# Patient Record
Sex: Female | Born: 1955 | Race: Black or African American | Hispanic: No | Marital: Married | Smoking: Never smoker
Health system: Southern US, Community
[De-identification: ages and names within clinical notes are randomized; demographics above are authoritative.]

## PROBLEM LIST (undated history)

## (undated) DIAGNOSIS — F32A Depression, unspecified: Secondary | ICD-10-CM

## (undated) DIAGNOSIS — I10 Essential (primary) hypertension: Secondary | ICD-10-CM

## (undated) DIAGNOSIS — F329 Major depressive disorder, single episode, unspecified: Secondary | ICD-10-CM

## (undated) DIAGNOSIS — T7840XA Allergy, unspecified, initial encounter: Secondary | ICD-10-CM

## (undated) DIAGNOSIS — I639 Cerebral infarction, unspecified: Secondary | ICD-10-CM

## (undated) DIAGNOSIS — E039 Hypothyroidism, unspecified: Secondary | ICD-10-CM

## (undated) DIAGNOSIS — E669 Obesity, unspecified: Secondary | ICD-10-CM

## (undated) DIAGNOSIS — E079 Disorder of thyroid, unspecified: Secondary | ICD-10-CM

## (undated) DIAGNOSIS — R7303 Prediabetes: Secondary | ICD-10-CM

## (undated) DIAGNOSIS — I499 Cardiac arrhythmia, unspecified: Secondary | ICD-10-CM

## (undated) DIAGNOSIS — M109 Gout, unspecified: Secondary | ICD-10-CM

## (undated) DIAGNOSIS — B029 Zoster without complications: Secondary | ICD-10-CM

## (undated) DIAGNOSIS — K219 Gastro-esophageal reflux disease without esophagitis: Secondary | ICD-10-CM

## (undated) DIAGNOSIS — F419 Anxiety disorder, unspecified: Secondary | ICD-10-CM

## (undated) DIAGNOSIS — I4891 Unspecified atrial fibrillation: Secondary | ICD-10-CM

## (undated) HISTORY — DX: Gastro-esophageal reflux disease without esophagitis: K21.9

## (undated) HISTORY — DX: Gout, unspecified: M10.9

## (undated) HISTORY — DX: Disorder of thyroid, unspecified: E07.9

## (undated) HISTORY — DX: Prediabetes: R73.03

## (undated) HISTORY — DX: Anxiety disorder, unspecified: F41.9

## (undated) HISTORY — DX: Obesity, unspecified: E66.9

## (undated) HISTORY — DX: Allergy, unspecified, initial encounter: T78.40XA

## (undated) HISTORY — DX: Hypothyroidism, unspecified: E03.9

## (undated) HISTORY — DX: Zoster without complications: B02.9

---

## 1998-12-06 ENCOUNTER — Emergency Department (HOSPITAL_COMMUNITY): Admission: EM | Admit: 1998-12-06 | Discharge: 1998-12-06 | Payer: Self-pay | Admitting: Emergency Medicine

## 1999-01-03 ENCOUNTER — Ambulatory Visit (HOSPITAL_COMMUNITY): Admission: RE | Admit: 1999-01-03 | Discharge: 1999-01-03 | Payer: Self-pay | Admitting: Internal Medicine

## 1999-01-03 ENCOUNTER — Encounter: Payer: Self-pay | Admitting: Internal Medicine

## 2001-09-19 ENCOUNTER — Other Ambulatory Visit: Admission: RE | Admit: 2001-09-19 | Discharge: 2001-09-19 | Payer: Self-pay | Admitting: Obstetrics and Gynecology

## 2002-05-29 ENCOUNTER — Encounter: Payer: Self-pay | Admitting: Emergency Medicine

## 2002-05-29 ENCOUNTER — Emergency Department (HOSPITAL_COMMUNITY): Admission: EM | Admit: 2002-05-29 | Discharge: 2002-05-29 | Payer: Self-pay | Admitting: Emergency Medicine

## 2005-06-10 ENCOUNTER — Other Ambulatory Visit: Admission: RE | Admit: 2005-06-10 | Discharge: 2005-06-10 | Payer: Self-pay | Admitting: Family Medicine

## 2005-10-14 ENCOUNTER — Encounter: Admission: RE | Admit: 2005-10-14 | Discharge: 2005-10-14 | Payer: Self-pay | Admitting: Family Medicine

## 2006-09-16 ENCOUNTER — Other Ambulatory Visit: Admission: RE | Admit: 2006-09-16 | Discharge: 2006-09-16 | Payer: Self-pay | Admitting: Obstetrics and Gynecology

## 2007-08-22 ENCOUNTER — Emergency Department (HOSPITAL_COMMUNITY): Admission: EM | Admit: 2007-08-22 | Discharge: 2007-08-22 | Payer: Self-pay | Admitting: Emergency Medicine

## 2011-10-18 ENCOUNTER — Ambulatory Visit (INDEPENDENT_AMBULATORY_CARE_PROVIDER_SITE_OTHER): Payer: 59

## 2011-10-18 DIAGNOSIS — S8010XA Contusion of unspecified lower leg, initial encounter: Secondary | ICD-10-CM

## 2011-10-18 DIAGNOSIS — S335XXA Sprain of ligaments of lumbar spine, initial encounter: Secondary | ICD-10-CM

## 2011-10-18 DIAGNOSIS — M62838 Other muscle spasm: Secondary | ICD-10-CM

## 2012-01-05 ENCOUNTER — Ambulatory Visit (INDEPENDENT_AMBULATORY_CARE_PROVIDER_SITE_OTHER): Payer: 59 | Admitting: Obstetrics and Gynecology

## 2012-01-05 DIAGNOSIS — Z01419 Encounter for gynecological examination (general) (routine) without abnormal findings: Secondary | ICD-10-CM

## 2014-05-04 ENCOUNTER — Emergency Department (HOSPITAL_COMMUNITY): Payer: 59

## 2014-05-04 ENCOUNTER — Encounter (HOSPITAL_COMMUNITY): Payer: Self-pay | Admitting: Emergency Medicine

## 2014-05-04 ENCOUNTER — Emergency Department (HOSPITAL_COMMUNITY)
Admission: EM | Admit: 2014-05-04 | Discharge: 2014-05-04 | Disposition: A | Discharge status: Home / Self Care | Payer: 59 | Attending: Emergency Medicine | Admitting: Emergency Medicine

## 2014-05-04 ENCOUNTER — Other Ambulatory Visit: Payer: Self-pay | Admitting: Physician Assistant

## 2014-05-04 DIAGNOSIS — I10 Essential (primary) hypertension: Secondary | ICD-10-CM | POA: Insufficient documentation

## 2014-05-04 DIAGNOSIS — R112 Nausea with vomiting, unspecified: Secondary | ICD-10-CM | POA: Insufficient documentation

## 2014-05-04 DIAGNOSIS — Z8659 Personal history of other mental and behavioral disorders: Secondary | ICD-10-CM | POA: Insufficient documentation

## 2014-05-04 DIAGNOSIS — R0789 Other chest pain: Secondary | ICD-10-CM

## 2014-05-04 DIAGNOSIS — Z7982 Long term (current) use of aspirin: Secondary | ICD-10-CM | POA: Insufficient documentation

## 2014-05-04 DIAGNOSIS — Z8673 Personal history of transient ischemic attack (TIA), and cerebral infarction without residual deficits: Secondary | ICD-10-CM | POA: Insufficient documentation

## 2014-05-04 DIAGNOSIS — Z79899 Other long term (current) drug therapy: Secondary | ICD-10-CM | POA: Insufficient documentation

## 2014-05-04 DIAGNOSIS — R079 Chest pain, unspecified: Secondary | ICD-10-CM

## 2014-05-04 HISTORY — DX: Cardiac arrhythmia, unspecified: I49.9

## 2014-05-04 HISTORY — DX: Essential (primary) hypertension: I10

## 2014-05-04 HISTORY — DX: Major depressive disorder, single episode, unspecified: F32.9

## 2014-05-04 HISTORY — DX: Cerebral infarction, unspecified: I63.9

## 2014-05-04 HISTORY — DX: Depression, unspecified: F32.A

## 2014-05-04 LAB — CBC WITH DIFFERENTIAL/PLATELET
BASOS ABS: 0 10*3/uL (ref 0.0–0.1)
Basophils Relative: 1 % (ref 0–1)
EOS PCT: 1 % (ref 0–5)
Eosinophils Absolute: 0.1 10*3/uL (ref 0.0–0.7)
HCT: 36.1 % (ref 36.0–46.0)
HEMOGLOBIN: 11.7 g/dL — AB (ref 12.0–15.0)
LYMPHS ABS: 1.4 10*3/uL (ref 0.7–4.0)
LYMPHS PCT: 25 % (ref 12–46)
MCH: 28.6 pg (ref 26.0–34.0)
MCHC: 32.4 g/dL (ref 30.0–36.0)
MCV: 88.3 fL (ref 78.0–100.0)
MONO ABS: 0.5 10*3/uL (ref 0.1–1.0)
MONOS PCT: 9 % (ref 3–12)
NEUTROS ABS: 3.7 10*3/uL (ref 1.7–7.7)
Neutrophils Relative %: 64 % (ref 43–77)
Platelets: 175 10*3/uL (ref 150–400)
RBC: 4.09 MIL/uL (ref 3.87–5.11)
RDW: 14.2 % (ref 11.5–15.5)
WBC: 5.7 10*3/uL (ref 4.0–10.5)

## 2014-05-04 LAB — COMPREHENSIVE METABOLIC PANEL
ALBUMIN: 3.6 g/dL (ref 3.5–5.2)
ALK PHOS: 74 U/L (ref 39–117)
ALT: 23 U/L (ref 0–35)
AST: 27 U/L (ref 0–37)
BUN: 16 mg/dL (ref 6–23)
CO2: 26 mEq/L (ref 19–32)
CREATININE: 0.72 mg/dL (ref 0.50–1.10)
Calcium: 9.7 mg/dL (ref 8.4–10.5)
Chloride: 99 mEq/L (ref 96–112)
GFR calc Af Amer: >90 mL/min (ref >90)
GFR calc non Af Amer: >90 mL/min (ref >90)
Glucose, Bld: 150 mg/dL — ABNORMAL HIGH (ref 70–99)
POTASSIUM: 3.5 meq/L — AB (ref 3.7–5.3)
Sodium: 140 mEq/L (ref 137–147)
Total Bilirubin: 0.6 mg/dL (ref 0.3–1.2)
Total Protein: 7.6 g/dL (ref 6.0–8.3)

## 2014-05-04 LAB — URINALYSIS, ROUTINE W REFLEX MICROSCOPIC
Bilirubin Urine: NEGATIVE
GLUCOSE, UA: NEGATIVE mg/dL
Hgb urine dipstick: NEGATIVE
Ketones, ur: NEGATIVE mg/dL
LEUKOCYTES UA: NEGATIVE
Nitrite: NEGATIVE
PH: 7.5 (ref 5.0–8.0)
Protein, ur: NEGATIVE mg/dL
Specific Gravity, Urine: 1.015 (ref 1.005–1.030)
Urobilinogen, UA: 1 mg/dL (ref 0.0–1.0)

## 2014-05-04 LAB — TROPONIN I
Troponin I: <0.3 ng/mL (ref <0.30)
Troponin I: <0.3 ng/mL (ref <0.30)

## 2014-05-04 IMAGING — CR DG CHEST 2V
2 series · 2 of 2 positions shown · non-contrast
Comparison: [DATE]

CLINICAL DATA: Chest pain, 2 days duration. History of
hypertension.

EXAM:
CHEST  2 VIEW

[w chest pa]
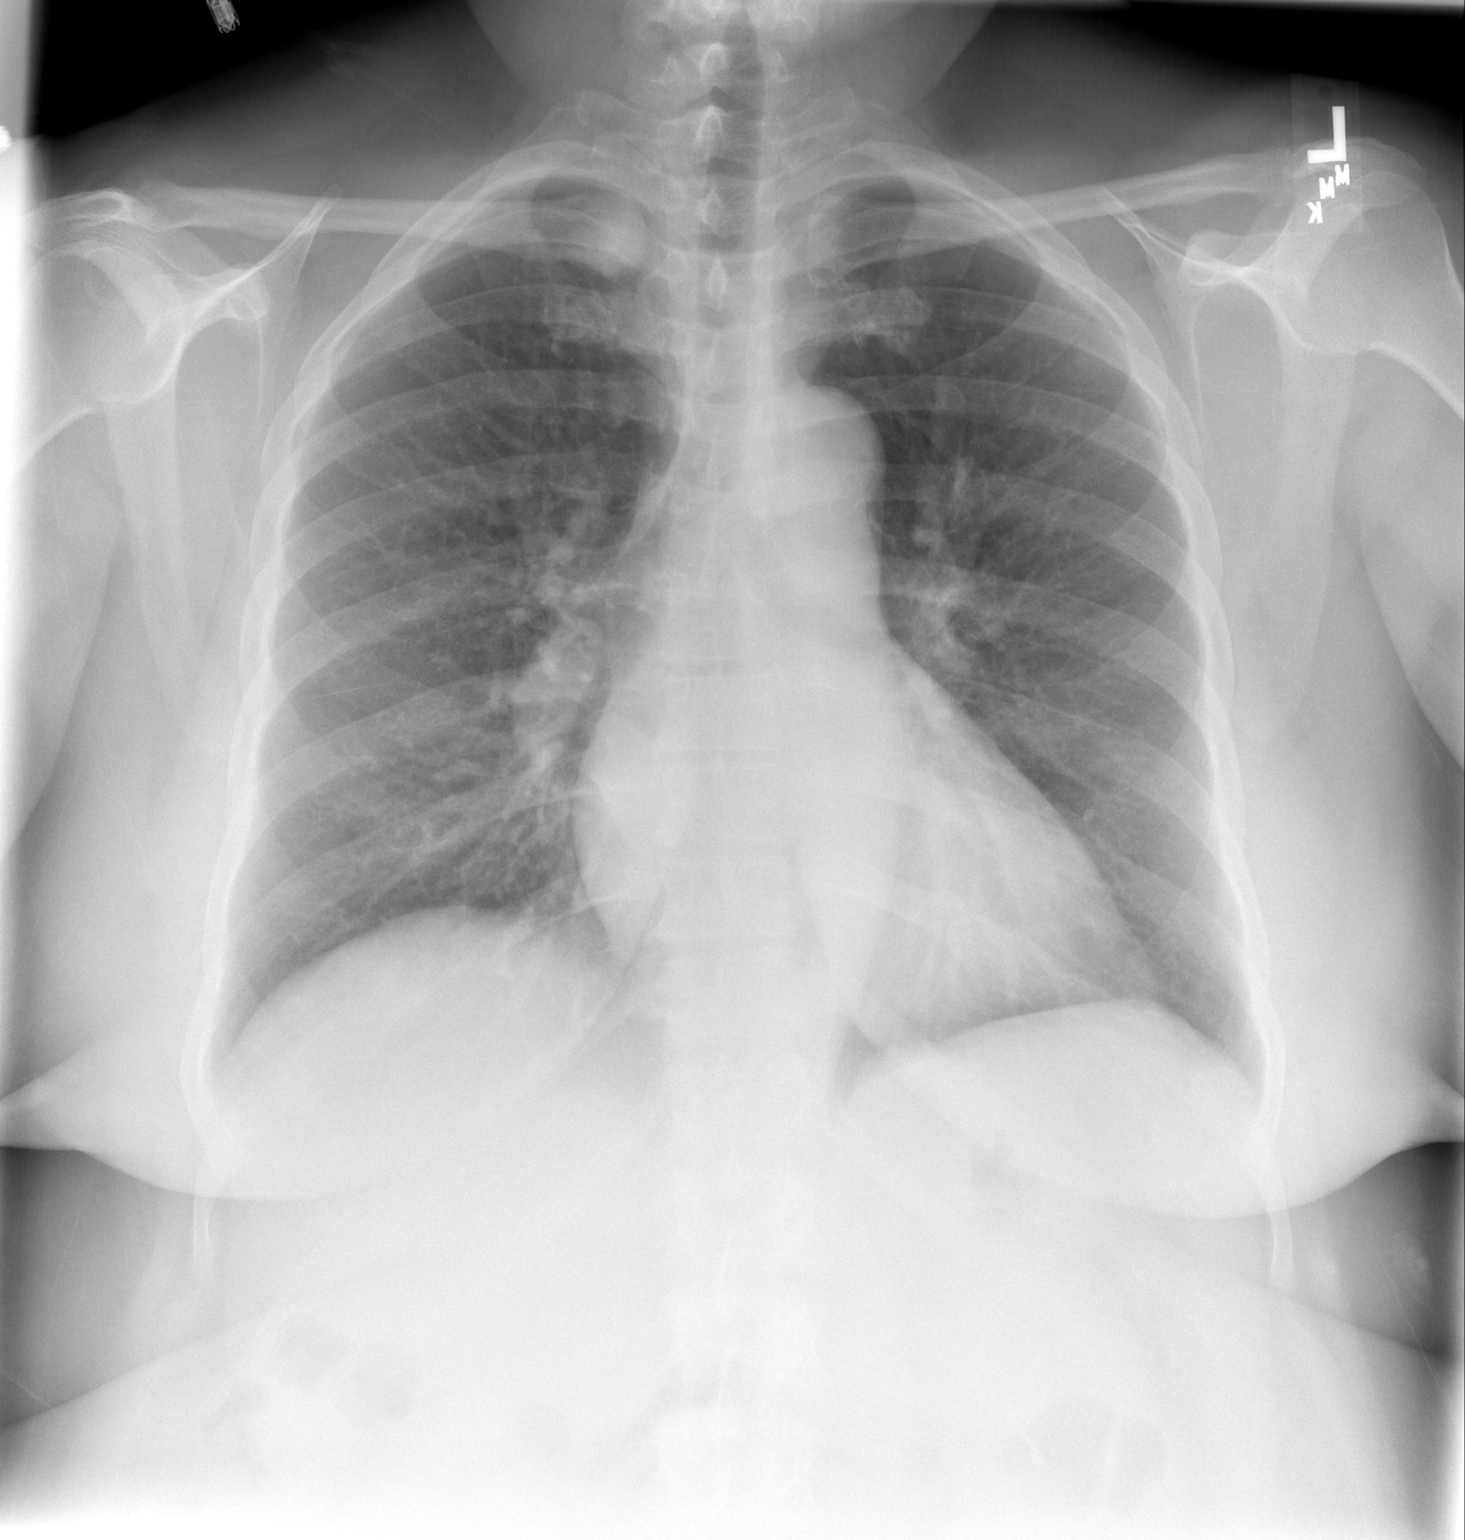

[w chest lat]
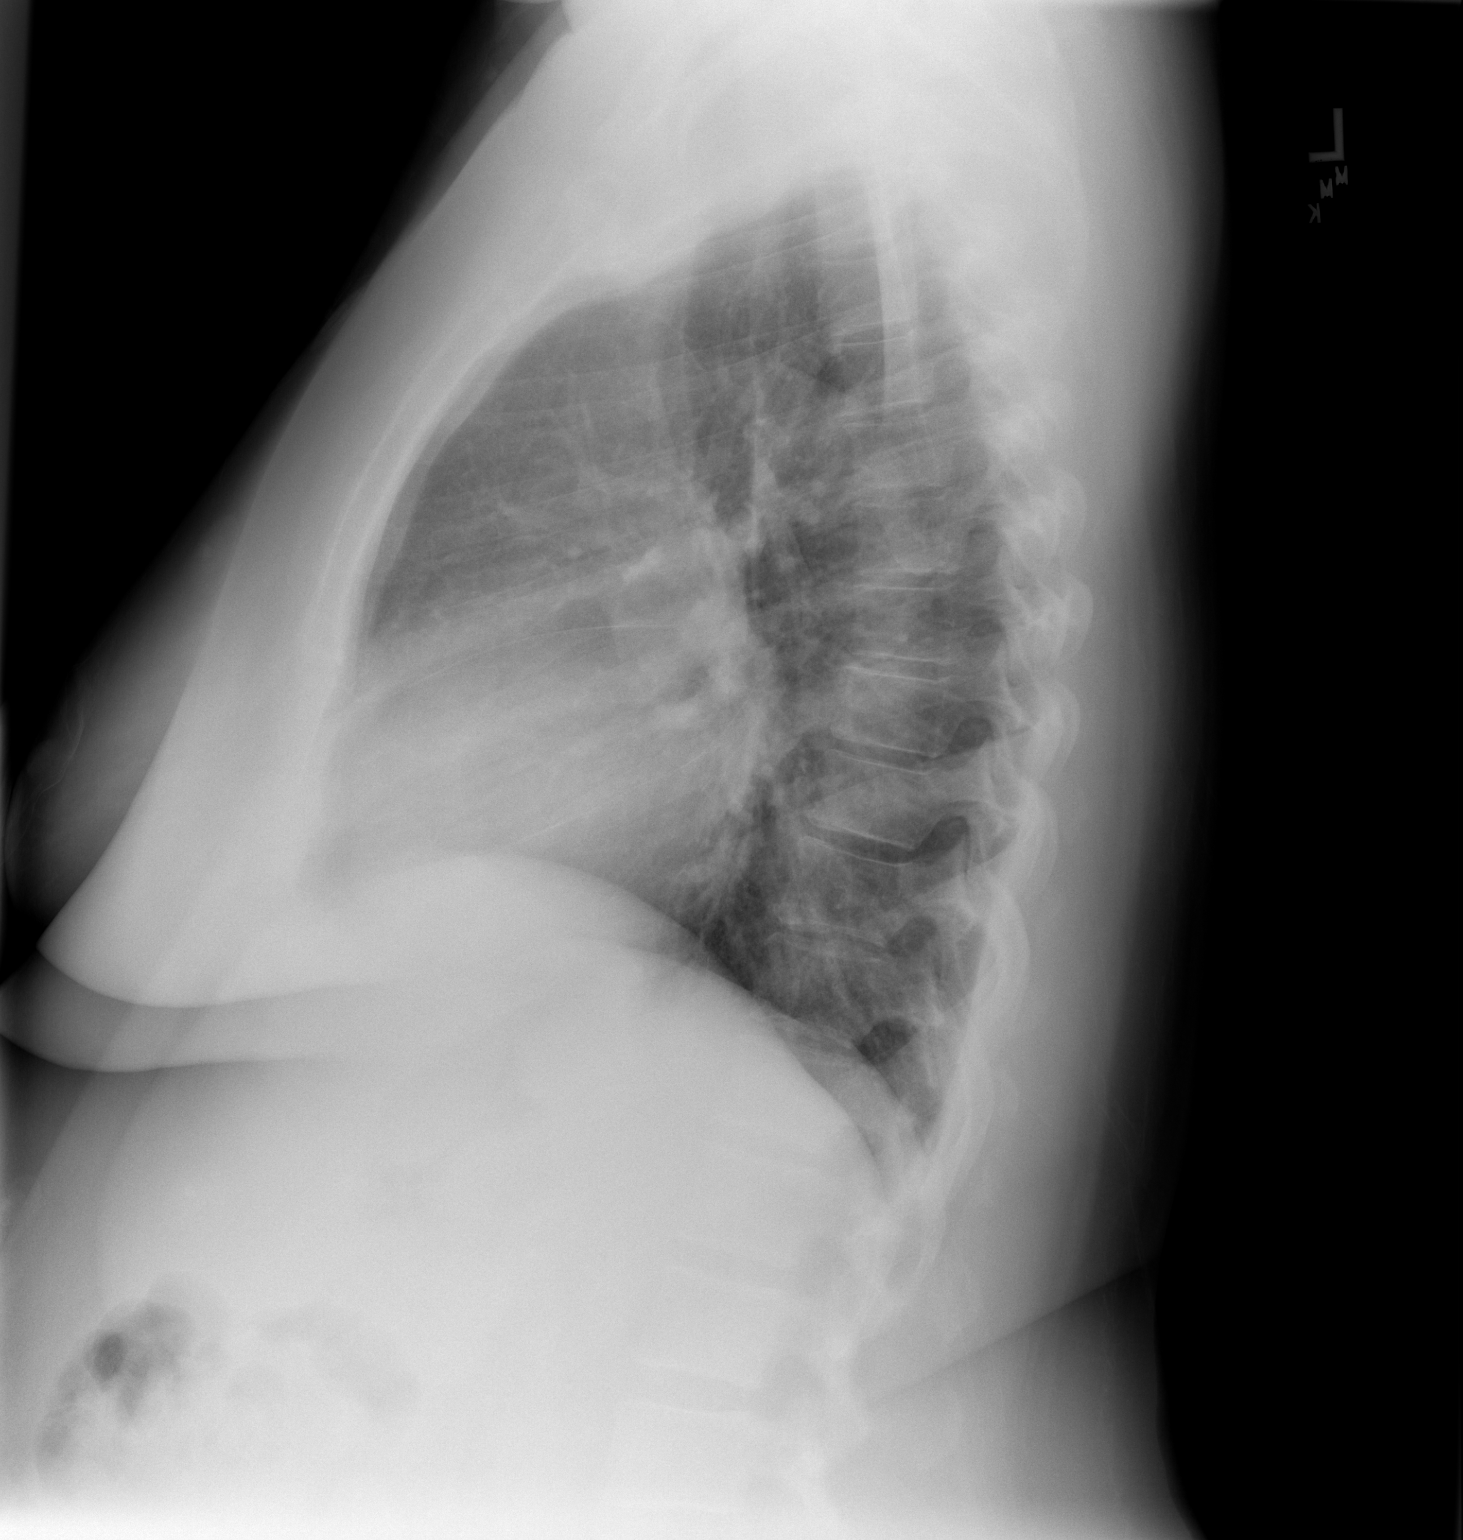

[2 of 2 positions shown; findings below may reference images not displayed]

FINDINGS: Heart size is normal. Mediastinal shadows are normal. The lungs are
clear. No effusions. There is sclerosis of the medial clavicle on
the right, increased since [DZ]. This is likely secondary to
degenerative arthritis. This does not represent a lung mass. No
spinal abnormality.
IMPRESSION: No active cardiopulmonary disease.

## 2014-05-04 MED ORDER — ASPIRIN 81 MG PO CHEW
324.0000 mg | CHEWABLE_TABLET | Freq: Once | ORAL | Status: AC
  Administered 2014-05-04: 324 mg via ORAL
  Filled 2014-05-04: qty 4

## 2014-05-04 MED ORDER — ASPIRIN 81 MG PO TBEC: 81.0000 mg | DELAYED_RELEASE_TABLET | Freq: Every day | ORAL | Status: DC

## 2014-05-04 NOTE — ED Notes (Signed)
Was at work and had cp  Got  nervous at work from talking to co workers  Had vomited this am  Pain mid sternal tightness

## 2014-05-04 NOTE — ED Provider Notes (Signed)
Medical screening examination/treatment/procedure(s) were conducted as a shared visit with non-physician practitioner(s) and myself.  I personally evaluated the patient during the encounter.  Episodes of chest pressure in setting of emotional stress. Associated with nausea.  No cardiac history. EKG with new T wave inversions inferior and laterally.   EKG Interpretation   Date/Time:  Friday May 04 2014 10:43:09 EDT Ventricular Rate:  86 PR Interval:  154 QRS Duration: 82 QT Interval:  368 QTC Calculation: 440 R Axis:   57 Text Interpretation:  Normal sinus rhythm Nonspecific T wave abnormality  Abnormal ECG T wave inversions lateral and inferior  Confirmed by Wyvonnia Dusky   MD, STEPHEN ([id number]) on 05/04/2014 12:39:47 PM       Ezequiel Essex, MD 05/04/14 1031

## 2014-05-04 NOTE — ED Notes (Signed)
Pt c/o 3-4 min episode of central CP, no radiation, that started after she got very upset, then had her husband drive her to the ED. Reports she also had an episode last night after having a stressful day at work, went home and got a HA and took ibuprofen. Pt sts she is feeling very anxious. Reports now the cp is pretty much gone but she is having some SOB. sts earlier she had nausea and vomited X 1. Denies abd pain. Nad, skin warm and dry, resp e/u.

## 2014-05-04 NOTE — ED Notes (Signed)
Pt returned from radiology. Ambulated to restroom with no issues.

## 2014-05-04 NOTE — Discharge Instructions (Signed)
Please follow up with your primary care physician in 1-2 days. If you do not have one please call the [facility] number listed above. Please follow up at your scheduled appointment with the cardiology office. Please take aspirin daily as prescribed. Please do not work until after your stress test as advised by the cardiologist. Please read all discharge instructions and return precautions.    Chest Pain (Nonspecific) It is often hard to give a specific diagnosis for the cause of chest pain. There is always a chance that your pain could be related to something serious, such as a heart attack or a blood clot in the lungs. You need to follow up with your health care provider for further evaluation. CAUSES   Heartburn.  Pneumonia or bronchitis.  Anxiety or stress.  Inflammation around your heart (pericarditis) or lung (pleuritis or pleurisy).  A blood clot in the lung.  A collapsed lung (pneumothorax). It can develop suddenly on its own (spontaneous pneumothorax) or from trauma to the chest.  Shingles infection (herpes zoster virus). The chest wall is composed of bones, muscles, and cartilage. Any of these can be the source of the pain.  The bones can be bruised by injury.  The muscles or cartilage can be strained by coughing or overwork.  The cartilage can be affected by inflammation and become sore (costochondritis). DIAGNOSIS  Lab tests or other studies may be needed to find the cause of your pain. Your health care provider may have you take a test called an ambulatory electrocardiogram (ECG). An ECG records your heartbeat patterns over a 24-hour period. You may also have other tests, such as:  Transthoracic echocardiogram (TTE). During echocardiography, sound waves are used to evaluate how blood flows through your heart.  Transesophageal echocardiogram (TEE).  Cardiac monitoring. This allows your health care provider to monitor your heart rate and rhythm in real  time.  Holter monitor. This is a portable device that records your heartbeat and can help diagnose heart arrhythmias. It allows your health care provider to track your heart activity for several days, if needed.  Stress tests by exercise or by giving medicine that makes the heart beat faster. TREATMENT   Treatment depends on what may be causing your chest pain. Treatment may include:  Acid blockers for heartburn.  Anti-inflammatory medicine.  Pain medicine for inflammatory conditions.  Antibiotics if an infection is present.  You may be advised to change lifestyle habits. This includes stopping smoking and avoiding alcohol, caffeine, and chocolate.  You may be advised to keep your head raised (elevated) when sleeping. This reduces the chance of acid going backward from your stomach into your esophagus. Most of the time, nonspecific chest pain will improve within 2-3 days with rest and mild pain medicine.  HOME CARE INSTRUCTIONS   If antibiotics were prescribed, take them as directed. Finish them even if you start to feel better.  For the next few days, avoid physical activities that bring on chest pain. Continue physical activities as directed.  Do not use any tobacco products, including cigarettes, chewing tobacco, or electronic cigarettes.  Avoid drinking alcohol.  Only take medicine as directed by your health care provider.  Follow your health care provider's suggestions for further testing if your chest pain does not go away.  Keep any follow-up appointments you made. If you do not go to an appointment, you could develop lasting (chronic) problems with pain. If there is any problem keeping an appointment, call to reschedule.  SEEK MEDICAL CARE IF:   Your chest pain does not go away, even after treatment.  You have a rash with blisters on your chest.  You have a fever. SEEK IMMEDIATE MEDICAL CARE IF:   You have increased chest pain or pain that spreads to your arm,  neck, jaw, back, or abdomen.  You have shortness of breath.  You have an increasing cough, or you cough up blood.  You have severe back or abdominal pain.  You feel nauseous or vomit.  You have severe weakness.  You faint.  You have chills. This is an emergency. Do not wait to see if the pain will go away. Get medical help at once. Call your local emergency services (911 in [place] Do not drive yourself to the hospital. MAKE SURE YOU:   Understand these instructions.  Will watch your condition.  Will get help right away if you are not doing well or get worse. Document Released: 08/05/2005 Document Revised: 10/31/2013 Document Reviewed: 05/31/2008 [facility] Patient Information 2015 [place], [place]. This information is not intended to replace advice given to you by your health care provider. Make sure you discuss any questions you have with your health care provider.

## 2014-05-04 NOTE — Consult Note (Signed)
CARDIOLOGY CONSULT NOTE   Patient ID: FRANSHESCA CHIPMAN MRN: [id number], DOB/AGE: 1956/05/30   Admit date: 05/04/2014 Date of Consult: 05/04/2014   Primary Physician: Osborne Casco, MD Primary Cardiologist: Dr. Candee Furbish, last seen in 2013  Pt. Profile  An obese 58 year old African American female with history of hypertension, stroke, depression, hypothyroidism, and history of irregular heartbeat presented with several episodes of chest pain after stressful encounter at work with her supervisor.  Problem List  Past Medical History  Diagnosis Date  . Depression   . Stroke   . Hypertension   . Irregular heart beat     Past Surgical History  Procedure Laterality Date  . Cesarean section       Allergies  No Known Allergies  HPI   The patient is obese 58 year old [place] American female with history of hypertension, stroke, depression, hypothyroidism, and history of irregular heartbeat. According to the patient, she never had any history of coronary disease nor did she ever had any stress test or echocardiogram. She was told her cholesterol was high however she should try to control it with diet. She was referred to an cardiologist in 2012 for your regular heartbeat. An EKG was obtained at that time and she was sent home with a monitor. She was subsequently placed on metoprolol since that time and has not had any problem since. She also has chronic lower extremity swelling for which she take triamterene/hydrochlorothiazide combination. She states she has been undergoing a lot of stress at work lately as she had a lot of problem with one of her supervisor who has been very demanding of her. She had one episode of chest pain with headache on Wednesday 05/02/2014 after a stressful encounter with her supervisor. The episode only lasted about 4 to 5 minutes and are relieved with resting. She described the symptom as substernal dull pressure that wraps around her neck. She states she  had similar episodes before Wednesday, she does not recall any starting point, however states her chest discomfort usually occur after a stressful situation. Yesterday, patient spent an hour cleaning her garage and move light boxes without significant chest discomfort. She states she has not been active at home. This morning around 10:00am, patient was put into a stressful situation again with the same supervisor. Shortly afterward, she had  pressure-like chest discomfort radiating to her neck, she also endorsed some dizziness, nausea, vomiting and mild SOB. The episode lasted about 5 minutes and quickly resolved with resting. Patient does not believe she was having a heart attack, however decided to seek medical attention at [facility].  On arrival, her vitals were normal. Significant laboratory finding include potassium of 3.5, creatinine 0.72, hemoglobin 11.7 and negative troponin. CXR is negative for acute process. EKG shows NSR with T wave inversion in anterior leads. The chest discomfort recurred again around 11 and again quickly resolved. Patient is currently asymptomatic. Cardiology was consulted for chest pain.  Inpatient Medications    Family History Family History  Problem Relation Age of Onset  . CAD Mother     started in her 44s  . CAD Father     started in his late 49s  . Diabetes Mellitus II Mother   . Hyperlipidemia Mother      Social History History   Social History  . Marital Status: Married    Spouse Name: N/A    Number of Children: N/A  . Years of Education: N/A   Occupational History  . Not  on file.   Social History Main Topics  . Smoking status: Never Smoker   . Smokeless tobacco: Not on file  . Alcohol Use: Yes     Comment: rarely drink once a month  . Drug Use: No     Comment: denies any illicit drug use  . Sexual Activity: Not on file   Other Topics Concern  . Not on file   Social History Narrative  . No narrative on file     Review of  Systems  General:  No chills, fever, night sweats or weight changes.  Cardiovascular:  No dyspnea on exertion, edema, orthopnea, palpitations, paroxysmal nocturnal dyspnea. + chest pain Dermatological: No rash, lesions/masses Respiratory: No cough. + SOB associated with chest pain, quickly resolved Urologic: No hematuria, dysuria Abdominal:   No diarrhea, bright red blood per rectum, melena, or hematemesis. +N/V Neurologic:  No visual changes, wkns, changes in mental status. All other systems reviewed and are otherwise negative except as noted above.  Physical Exam  Blood pressure 134/60, pulse 71, temperature 97.8 F (36.6 C), temperature source Oral, resp. rate 20, SpO2 100.00%.  General: Pleasant, NAD Psych: Normal affect. Neuro: Alert and oriented X 3. Moves all extremities spontaneously. HEENT: Normal  Neck: Supple without bruits or JVD. Lungs:  Resp regular and unlabored, CTA. Heart: RRR no s3, s4, or murmurs. Abdomen: Soft, non-tender, non-distended, BS + x 4.  Extremities: No clubbing, cyanosis or edema. DP/PT/Radials 2+ and equal bilaterally.  Labs   Recent Labs  05/04/14 1055  TROPONINI <0.30   Lab Results  Component Value Date   WBC 5.7 05/04/2014   HGB 11.7* 05/04/2014   HCT 36.1 05/04/2014   MCV 88.3 05/04/2014   PLT 175 05/04/2014    Recent Labs Lab 05/04/14 1055  NA 140  K 3.5*  CL 99  CO2 26  BUN 16  CREATININE 0.72  CALCIUM 9.7  PROT 7.6  BILITOT 0.6  ALKPHOS 74  ALT 23  AST 27  GLUCOSE 150*    Radiology/Studies  Dg Chest 2 View  05/04/2014   CLINICAL DATA:  Chest pain, 2 days duration. History of hypertension.  EXAM: CHEST  2 VIEW  COMPARISON:  10/14/2005  FINDINGS: Heart size is normal. Mediastinal shadows are normal. The lungs are clear. No effusions. There is sclerosis of the medial clavicle on the right, increased since 2006. 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 This does not represent a lung mass. No spinal abnormality.   IMPRESSION: No active cardiopulmonary disease.   Electronically Signed   By: Nelson Chimes M.D.   On: 05/04/2014 11:48    ECG  NSR with HR 80s, t wave inversion in anterior leads   ASSESSMENT AND PLAN  1. Atypical chest pain in the setting of emotional stress  - discussed workup including obs overnight with stress test in the morning vs outpatient stress test, patient want outpatient stress test  - add aspirin 81mg  daily to her regimen, continue metoprolol  - plan for outpatient stress test in [facility] early next week  - office will call patient to schedule  - no work prior to stress test result  - patient advised to seek medical attention if having unremitting CP for >6minutes or severe SOB or dizziness, at which time, she is advised to seek medical attention immediately  - ok to discharge from ED   2. Hypertension: well controlled   Hilbert Corrigan, PA-C 05/04/2014, 1:53 PM  Patient seen and examined with Isaac Laud  Meng, PA-C. We discussed all aspects of the encounter. I agree with the assessment and plan as stated above.   CP with mostly atypical features. However she has several CRFs. CE normal. ECG non-specific abnormalities but no old to compare. No exertional symptoms. We discussed options of overnight observation with stress test in am or outpatient stress test. She prefers the latter. She knows to return immediately if CP returns.  Daniel Bensimhon,MD 4:28 PM

## 2014-05-04 NOTE — ED Provider Notes (Signed)
CSN: [id number]     Arrival date & time 05/04/14  1038 History   First MD Initiated Contact with Patient 05/04/14 1056     Chief Complaint  Patient presents with  . Chest Pain     (Consider location/radiation/quality/duration/timing/severity/associated sxs/prior Treatment) HPI Comments: Patient is a 58 year old female past medical history significant for depression, stroke and hypertension presented to the emergency department for 2 episodes of central chest pressure w/o radiation. First episode was last night right before bed, resolved with ibuprofen. Patient states she was at work this morning is having a disagreement with his coworkers, felt very nervous and developed central chest pressure with nausea and one episode of nonbloody nonbilious vomiting around 9:45 AM. Patient states the chest pressure has improved greatly he states that, "it is still present." Patient has not been seen by a cardiologist in several years. No history of stress test, echocardiogram, cardiac catheterization. Patient has worn a Holter monitor in the past.   Past Medical History  Diagnosis Date  . Depression   . Stroke   . Hypertension   . Irregular heart beat    Past Surgical History  Procedure Laterality Date  . Cesarean section     Family History  Problem Relation Age of Onset  . CAD Mother     started in her 21s  . CAD Father     started in his late 81s  . Diabetes Mellitus II Mother   . Hyperlipidemia Mother    History  Substance Use Topics  . Smoking status: Never Smoker   . Smokeless tobacco: Not on file  . Alcohol Use: Yes     Comment: rarely drink once a month   OB History   Grav Para Term Preterm Abortions TAB SAB Ect Mult Living                 Review of Systems  Constitutional: Negative for fever and chills.  Respiratory: Negative for shortness of breath.   Cardiovascular: Positive for chest pain.  Gastrointestinal: Positive for nausea and vomiting. Negative for abdominal pain.   All other systems reviewed and are negative.     Allergies  Review of patient's allergies indicates no known allergies.  Home Medications   Prior to Admission medications   Medication Sig Start Date End Date Taking? Authorizing Provider  levothyroxine (SYNTHROID, LEVOTHROID) 75 MCG tablet Take 75 mcg by mouth daily before breakfast.   Yes Historical Provider, MD  metoprolol succinate (TOPROL-XL) 25 MG 24 hr tablet Take 25 mg by mouth daily.   Yes Historical Provider, MD  Multiple Vitamin (MULTIVITAMIN WITH MINERALS) TABS tablet Take 1 tablet by mouth daily.   Yes Historical Provider, MD  potassium chloride (K-DUR) 10 MEQ tablet Take 10 mEq by mouth every other day.    Yes Historical Provider, MD  PRESCRIPTION MEDICATION Take 1 tablet by mouth daily. Patient states she takes a medication for depression.  I called her pharmacy and they have not filled anything since 2013 for depression.  Husband will get bottles and bring in if patient stays.   Yes Historical Provider, MD  triamterene-hydrochlorothiazide (MAXZIDE) 75-50 MG per tablet Take 1 tablet by mouth daily.   Yes Historical Provider, MD  aspirin 81 MG EC tablet Take 1 tablet (81 mg total) by mouth daily. Swallow whole. 05/04/14   Saed Hudlow L Hasnain Manheim, PA-C   BP 133/65  Pulse 69  Temp(Src) 97.8 F (36.6 C) (Oral)  Resp 20  SpO2 99% Physical Exam  Nursing note  and vitals reviewed. Constitutional: She is oriented to person, place, and time. She appears well-developed and well-nourished. No distress.  HENT:  Head: Normocephalic and atraumatic.  Right Ear: External ear normal.  Left Ear: External ear normal.  Nose: Nose normal.  Mouth/Throat: Oropharynx is clear and moist. No oropharyngeal exudate.  Eyes: Conjunctivae are normal.  Neck: Neck supple.  Cardiovascular: Normal rate, regular rhythm, normal heart sounds and intact distal pulses.   Pulmonary/Chest: Effort normal and breath sounds normal. No accessory muscle usage. No  respiratory distress. She exhibits no tenderness.  Abdominal: Soft. Bowel sounds are normal. There is no tenderness.  Musculoskeletal: Normal range of motion. She exhibits no edema.  Neurological: She is alert and oriented to person, place, and time.  Skin: Skin is warm and dry. She is not diaphoretic.    ED Course  Procedures (including critical care time) Medications  aspirin chewable tablet 324 mg (324 mg Oral Given 05/04/14 1323)    Labs Review Labs Reviewed  COMPREHENSIVE METABOLIC PANEL - Abnormal; Notable for the following:    Potassium 3.5 (*)    Glucose, Bld 150 (*)    All other components within normal limits  CBC WITH DIFFERENTIAL - Abnormal; Notable for the following:    Hemoglobin 11.7 (*)    All other components within normal limits  URINALYSIS, ROUTINE W REFLEX MICROSCOPIC  TROPONIN I  TROPONIN I    Imaging Review Dg Chest 2 View  05/04/2014   CLINICAL DATA:  Chest pain, 2 days duration. History of hypertension.  EXAM: CHEST  2 VIEW  COMPARISON:  10/14/2005  FINDINGS: Heart size is normal. Mediastinal shadows are normal. The lungs are clear. No effusions. There is sclerosis of the medial clavicle on the right, increased since 2006. 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 This does not represent a lung mass. No spinal abnormality.  IMPRESSION: No active cardiopulmonary disease.   Electronically Signed   By: Nelson Chimes M.D.   On: 05/04/2014 11:48     EKG Interpretation   Date/Time:  Friday May 04 2014 10:43:09 EDT Ventricular Rate:  86 PR Interval:  154 QRS Duration: 82 QT Interval:  368 QTC Calculation: 440 R Axis:   57 Text Interpretation:  Normal sinus rhythm Nonspecific T wave abnormality  Abnormal ECG T wave inversions lateral and inferior  Confirmed by Wyvonnia Dusky   MD, STEPHEN ([id number]) on 05/04/2014 12:39:47 PM      MDM   Final diagnoses:  Chest pain, atypical    Filed Vitals:   05/04/14 1400  BP: 133/65  Pulse: 69  Temp:   Resp:      Low suspicion for PE, VSS, no tracheal deviation, no JVD or new murmur, RRR, breath sounds equal bilaterally, EKG with new T wave abnormalities, negative delta troponin, and negative CXR. Pt has been advised to return to the ED is CP becomes exertional, associated with diaphoresis or nausea, radiates to left jaw/arm, worsens or becomes concerning in any way.   Cardiology has seen and evaluated the patient, offered observation overnight with stress test in hospital or outpatient scheduled stress test. Patient is requesting to be discharged home with outpatient stress test scheduled early next week with instructions to not work before stress test. She will also be started on 81 mg of aspirin daily as advised by Cardiology.  Pt appears reliable for follow up and is agreeable to discharge.   Case has been discussed with and seen by Dr. Wyvonnia Dusky who agrees with the above plan  to discharge.    [place], PA-C 05/04/14 1600

## 2014-05-04 NOTE — ED Notes (Signed)
Pt still in radiology.

## 2014-05-08 ENCOUNTER — Ambulatory Visit (HOSPITAL_COMMUNITY)
Admission: RE | Admit: 2014-05-08 | Discharge: 2014-05-08 | Disposition: A | Discharge status: Home / Self Care | Payer: 59 | Source: Ambulatory Visit | Attending: Cardiovascular Disease | Admitting: Cardiovascular Disease

## 2014-05-08 DIAGNOSIS — R110 Nausea: Secondary | ICD-10-CM | POA: Insufficient documentation

## 2014-05-08 DIAGNOSIS — R0989 Other specified symptoms and signs involving the circulatory and respiratory systems: Secondary | ICD-10-CM | POA: Insufficient documentation

## 2014-05-08 DIAGNOSIS — R42 Dizziness and giddiness: Secondary | ICD-10-CM | POA: Insufficient documentation

## 2014-05-08 DIAGNOSIS — R002 Palpitations: Secondary | ICD-10-CM | POA: Insufficient documentation

## 2014-05-08 DIAGNOSIS — R0602 Shortness of breath: Secondary | ICD-10-CM | POA: Insufficient documentation

## 2014-05-08 DIAGNOSIS — R0789 Other chest pain: Secondary | ICD-10-CM

## 2014-05-08 DIAGNOSIS — R0609 Other forms of dyspnea: Secondary | ICD-10-CM | POA: Insufficient documentation

## 2014-05-08 DIAGNOSIS — R079 Chest pain, unspecified: Secondary | ICD-10-CM | POA: Insufficient documentation

## 2014-05-08 DIAGNOSIS — R5383 Other fatigue: Secondary | ICD-10-CM

## 2014-05-08 DIAGNOSIS — R5381 Other malaise: Secondary | ICD-10-CM | POA: Insufficient documentation

## 2014-05-08 DIAGNOSIS — R55 Syncope and collapse: Secondary | ICD-10-CM | POA: Insufficient documentation

## 2014-05-08 MED ORDER — REGADENOSON 0.4 MG/5ML IV SOLN
0.4000 mg | Freq: Once | INTRAVENOUS | Status: AC
  Administered 2014-05-08: 0.4 mg via INTRAVENOUS

## 2014-05-08 MED ORDER — TECHNETIUM TC 99M SESTAMIBI GENERIC - CARDIOLITE
30.0000 | Freq: Once | INTRAVENOUS | Status: AC | PRN
  Administered 2014-05-08: 30 via INTRAVENOUS

## 2014-05-08 MED ORDER — TECHNETIUM TC 99M SESTAMIBI GENERIC - CARDIOLITE
10.0000 | Freq: Once | INTRAVENOUS | Status: AC | PRN
  Administered 2014-05-08: 10 via INTRAVENOUS

## 2014-05-08 NOTE — Procedures (Addendum)
[facility] [address] [place] [address] [phone]  Cardiology Nuclear Med Study  Donna Larsen is a 58 y.o. female     MRN : [id number]     DOB: 1956/02/26  Procedure Date: 05/08/2014  Nuclear Med Background Indication for Stress Test:  Evaluation for Ischemia and [facility] History:  No prior cardiac or respiratory history reported;No prior NUC MPI for comparison. Cardiac Risk Factors: Family History - CAD, Hypertension, Lipids and Overweight  Symptoms:  Chest Pain, Dizziness, DOE, Fatigue, Nausea, Near Syncope, Palpitations and SOB   Nuclear Pre-Procedure Caffeine/Decaff Intake:  7:00pm NPO After: 5:00am   IV Site: R Forearm  IV 0.9% NS with Angio Cath:  22g  Chest Size (in):  n/a IV Started by: Rolene Course, RN  Height: 5\' 4"  (1.626 m)  Cup Size: C  BMI:  Body mass index is 40.32 kg/(m^2). Weight:  235 lb (106.595 kg)   Tech Comments:  n/a    Nuclear Med Study 1 or 2 day study: 1 day  Stress Test Type:  [place] Authorizing Provider:  Candee Furbish, MD   Resting Radionuclide: Technetium 60m Sestamibi  Resting Radionuclide Dose: 9.9 mCi   Stress Radionuclide:  Technetium 63m Sestamibi  Stress Radionuclide Dose: 30.5 mCi           Stress Protocol Rest HR: 63 Stress HR: 77  Rest BP: 129/88 Stress BP: 138/81  Exercise Time (min): n/a METS: n/a   Predicted Max HR: 163 bpm % Max HR: 47.24 bpm Rate Pressure Product: 10626  Dose of Adenosine (mg):  n/a Dose of Lexiscan: 0.4 mg  Dose of Atropine (mg): n/a Dose of Dobutamine: n/a mcg/kg/min (at max HR)  Stress Test Technologist: Leane Para, CCT Nuclear Technologist: Otho Perl, CNMT   Rest Procedure:  Myocardial perfusion imaging was performed at rest 45 minutes following the intravenous administration of Technetium 55m Sestamibi. Stress Procedure:  The patient received IV Lexiscan 0.4 mg over 15-seconds.  Technetium 45m Sestamibi  injected IV at 30-seconds.  There were no significant changes with Lexiscan.  Quantitative spect images were obtained after a 45 minute delay.  Transient Ischemic Dilatation (Normal <1.22):  1.27 QGS EDV:  79 ml QGS ESV:  29 ml LV Ejection Fraction: 64%  Rest ECG: NSR - Normal EKG  Stress ECG: No significant change from baseline ECG  QPS Raw Data Images:  There is interference from nuclear activity from structures below the diaphragm. This does not affect the ability to read the study. Stress Images:  There is decreased uptake in the inferior wall. Rest Images:  Normal homogeneous uptake in all areas of the myocardium. Subtraction (SDS):  No evidence of ischemia.  Impression Exercise Capacity:  Lexiscan with no exercise. BP Response:  Normal blood pressure response. Clinical Symptoms:  No significant symptoms noted. ECG Impression:  No significant ECG changes with Lexiscan. Comparison with Prior Nuclear Study: No previous nuclear study performed  Overall Impression:  Low risk stress nuclear study with bowel attenuation artifact. No significant reversible ischemia. TID ratio elevated at 1.27. Clinical correlation advised..  LV Wall Motion:  NL LV Function; NL Wall Motion; EF 64%.  Pixie Casino, MD, [facility] Board Certified in Nuclear Cardiology Attending Cardiologist [place], MD  05/08/2014 12:37 PM

## 2014-05-15 ENCOUNTER — Telehealth (HOSPITAL_BASED_OUTPATIENT_CLINIC_OR_DEPARTMENT_OTHER): Payer: Self-pay | Admitting: Emergency Medicine

## 2014-05-15 NOTE — Telephone Encounter (Signed)
Rx for aspirin changed from [place]. 30 with 12 refills to 400 bulk pack so patient could use Flex Card.

## 2014-06-01 ENCOUNTER — Encounter: Payer: Self-pay | Admitting: Cardiology

## 2014-06-01 ENCOUNTER — Ambulatory Visit (INDEPENDENT_AMBULATORY_CARE_PROVIDER_SITE_OTHER): Payer: 59 | Admitting: Cardiology

## 2014-06-01 VITALS — BP 140/70 | HR 59 | Ht 64.0 in | Wt 236.0 lb

## 2014-06-01 DIAGNOSIS — R079 Chest pain, unspecified: Secondary | ICD-10-CM

## 2014-06-01 NOTE — Progress Notes (Signed)
          06/01/2014   PCP: Osborne Casco, MD   Chief Complaint  Patient presents with  . Appointment    follow stress test pt has not had any chest pain ,sob, headaches ,dizziness, or edema    Primary Cardiologist: Dr. Marlou Porch  HPI:  58 year old African American female with history of hypertension, stroke, depression, hypothyroidism, and history of irregular heartbeat presented with several episodes of chest pain after stressful encounter at work with her supervisor.  She was seen in ER by Dr. Haroldine Laws. Previously had been seen by Dr. Marlou Porch for irregular HR.    She underwent Lexiscan myoview without ischemia.  EF was normal at 64%.  She does have family hx CAD.  No pain since episode. She does exercise without problems. She admits her cholesterol is slightly elevated and is controlling through diet.  No further palpitations.    No Known Allergies  Current Outpatient Prescriptions  Medication Sig Dispense Refill  . aspirin 81 MG EC tablet Take 1 tablet (81 mg total) by mouth daily. Swallow whole.  30 tablet  12  . FLUoxetine (PROZAC) 20 MG capsule Take 1 tab daily      . levothyroxine (SYNTHROID, LEVOTHROID) 75 MCG tablet Take 75 mcg by mouth daily before breakfast.      . LORazepam (ATIVAN) 1 MG tablet As directed      . metoprolol succinate (TOPROL-XL) 25 MG 24 hr tablet Take 25 mg by mouth daily.      . Multiple Vitamin (MULTIVITAMIN WITH MINERALS) TABS tablet Take 1 tablet by mouth daily.      . potassium chloride (K-DUR) 10 MEQ tablet Take 10 mEq by mouth every other day.       . triamterene-hydrochlorothiazide (MAXZIDE) 75-50 MG per tablet Take 1 tablet by mouth daily.       No current facility-administered medications for this visit.    Past Medical History  Diagnosis Date  . Depression   . Stroke   . Hypertension   . Irregular heart beat     Past Surgical History  Procedure Laterality Date  . Cesarean section      WGY:KZLDJTT:SV colds or  fevers, no weight changes Skin:no rashes or ulcers HEENT:no blurred vision, no congestion CV:see HPI PUL:see HPI GI:no diarrhea constipation or melena, no indigestion GU:no hematuria, no dysuria MS:no joint pain, no claudication Neuro:no syncope, no lightheadedness Endo:no diabetes, + thyroid disease  Wt Readings from Last 3 Encounters:  06/01/14 236 lb (107.049 kg)  05/08/14 235 lb (106.595 kg)    PHYSICAL EXAM BP 140/70  Pulse 59  Ht 5\' 4"  (1.626 m)  Wt 236 lb (107.049 kg)  BMI 40.49 kg/m2 General:Pleasant affect, NAD Skin:Warm and dry, brisk capillary refill HEENT:normocephalic, sclera clear, mucus membranes moist Neck:supple, no JVD, no bruits  Heart:S1S2 RRR without murmur, gallup, rub or click Lungs:clear without rales, rhonchi, or wheezes Abd: obese,soft, non tender, + BS, do not palpate liver spleen or masses Ext:no lower ext edema, 2+ pedal pulses, 2+ radial pulses Neuro:alert and oriented, MAE, follows commands, + facial symmetry  XBL:TJQZE brady, no acute changes, continues with non specific t wave abnormalities.  ASSESSMENT AND PLAN Chest pain No further chest pain, negative lexiscan myoview.  Discussed importance of exercise.  She will follow up with Dr. Marlou Porch in 1 year or earlier if problems.

## 2014-06-01 NOTE — Assessment & Plan Note (Signed)
No further chest pain, negative lexiscan myoview.  Discussed importance of exercise.  She will follow up with Dr. Marlou Porch in 1 year or earlier if problems.

## 2014-06-01 NOTE — Patient Instructions (Signed)
Your physician recommends that you schedule a follow-up appointment in 1 year with Dr Marlou Porch.

## 2016-10-17 ENCOUNTER — Ambulatory Visit (INDEPENDENT_AMBULATORY_CARE_PROVIDER_SITE_OTHER): Payer: 59 | Admitting: Family Medicine

## 2016-10-17 VITALS — BP 140/84 | HR 78 | Temp 98.8°F | Resp 18 | Ht 64.0 in | Wt 253.0 lb

## 2016-10-17 DIAGNOSIS — M5412 Radiculopathy, cervical region: Secondary | ICD-10-CM

## 2016-10-17 MED ORDER — TRAMADOL HCL 50 MG PO TABS: 50.0000 mg | ORAL_TABLET | Freq: Three times a day (TID) | ORAL | 0 refills | Status: DC | PRN

## 2016-10-17 MED ORDER — PREDNISONE 20 MG PO TABS: 40.0000 mg | ORAL_TABLET | Freq: Every day | ORAL | 0 refills | Status: DC

## 2016-10-17 MED ORDER — CYCLOBENZAPRINE HCL 10 MG PO TABS: 10.0000 mg | ORAL_TABLET | Freq: Three times a day (TID) | ORAL | 0 refills | Status: DC | PRN

## 2016-10-17 NOTE — Progress Notes (Signed)
Patient ID: Donna Larsen, female    DOB: 09/02/1956, 60 y.o.   MRN: [id number]  PCP: Osborne Casco, MD  Chief Complaint  Patient presents with  . Neck Pain    Right side starting 4am this morning  . Shoulder Pain  . Arm Pain    Subjective:   HPI 60 year old female presents for evaluation of right neck, shoulder and arm pain. Pt presents newly to [facility]. Reports yesterday getting her Christmas tree out of the attic and putting it up. Doesn't recall injuring her neck or shoulder.  Woke up at 4 am with neck pain radiating down the right shoulder arm with tingling of right fingers. She took ibuprofen with minimal relief. Reports full range of motion of neck. Pain characterized as aching to sharp intervals. Denies prior neck or shoulder injury.  Social History   Social History  . Marital status: Married    Spouse name: N/A  . Number of children: N/A  . Years of education: N/A   Occupational History  . Not on file.   Social History Main Topics  . Smoking status: Never Smoker  . Smokeless tobacco: Never Used  . Alcohol use Yes     Comment: rarely drink once a month  . Drug use: No     Comment: denies any illicit drug use  . Sexual activity: Not on file   Other Topics Concern  . Not on file   Social History Narrative  . No narrative on file    Family History  Problem Relation Age of Onset  . CAD Mother     started in her 54s  . Diabetes Mellitus II Mother   . Hyperlipidemia Mother   . CAD Father     started in his late 39s    Review of Systems See HPI    Patient Active Problem List   Diagnosis Date Noted  . Chest pain 05/04/2014     Prior to Admission medications   Medication Sig Start Date End Date Taking? Authorizing Provider  aspirin 81 MG EC tablet Take 1 tablet (81 mg total) by mouth daily. Swallow whole. 05/04/14  Yes Jennifer Piepenbrink, PA-C  FLUoxetine (PROZAC) 20 MG capsule Take 1 tab daily 05/04/14  Yes Historical Provider, MD    levothyroxine (SYNTHROID, LEVOTHROID) 75 MCG tablet Take 75 mcg by mouth daily before breakfast.   Yes Historical Provider, MD  LORazepam (ATIVAN) 1 MG tablet As directed 05/15/14  Yes Historical Provider, MD  metoprolol succinate (TOPROL-XL) 25 MG 24 hr tablet Take 25 mg by mouth daily.   Yes Historical Provider, MD  Multiple Vitamin (MULTIVITAMIN WITH MINERALS) TABS tablet Take 1 tablet by mouth daily.   Yes Historical Provider, MD  potassium chloride (K-DUR) 10 MEQ tablet Take 10 mEq by mouth every other day.    Yes Historical Provider, MD  triamterene-hydrochlorothiazide (MAXZIDE) 75-50 MG per tablet Take 1 tablet by mouth daily.   Yes Historical Provider, MD  No Known Allergies     Objective:  Physical Exam  Constitutional: She is oriented to person, place, and time.  Neck: Normal range of motion and full passive range of motion without pain. Neck rigidity present.    Cardiovascular: Normal rate, regular rhythm, normal heart sounds and intact distal pulses.   Pulmonary/Chest: Effort normal and breath sounds normal.  Musculoskeletal:       Right shoulder: She exhibits spasm. She exhibits no tenderness, no swelling and no effusion.  Tenderness with adduction of right  shoulder  Tenderness produced with right hand gripping motion  Lymphadenopathy:    She has no cervical adenopathy.  Neurological: She is alert and oriented to person, place, and time.  Skin: Skin is warm and dry.  Psychiatric: She has a normal mood and affect. Her behavior is normal. Judgment and thought content normal.    Vitals:   10/17/16 0958  BP: 140/84  Pulse: 78  Resp: 18  Temp: 98.8 F (37.1 C)     Assessment & Plan:  1. Cervical radiculopathy, likely as a result of cervical strain.  Plan: Prednisone 40 mg once daily with breakfast times 5 days. Tramadol 50 mg every 8 hours as needed for pain. Cyclobenzaprine 10 mg up to 3 times daily for pain.  Return for care if symptoms worsen or do not  improve.  Carroll Sage. Kenton Kingfisher, MSN, FNP-C [facility]

## 2016-10-17 NOTE — Patient Instructions (Addendum)
Take Prednisone 2 tablets (40 mg) once daily with breakfast for 5 days. For muscle spasms, take cyclobenzaprine 10 mg up to 3 times daily. May cause drowsiness, caution when driving. For severe pain, may take Tramadol 50 mg every 8 hours as needed  Return for care if symptoms worsen or do not improve.   IF you received an x-ray today, you will receive an invoice from [facility]. Please contact [facility] at [phone] with questions or concerns regarding your invoice.   IF you received labwork today, you will receive an invoice from [employer]. Please contact [facility] at [phone] with questions or concerns regarding your invoice.   Our billing staff will not be able to assist you with questions regarding bills from these companies.  You will be contacted with the lab results as soon as they are available. The fastest way to get your results is to activate your My Chart account. Instructions are located on the last page of this paperwork. If you have not heard from [place] regarding the results in 2 weeks, please contact this office.     Cervical Radiculopathy Introduction Cervical radiculopathy means that a nerve in the neck is pinched or bruised. This can cause pain or loss of feeling (numbness) that runs from your neck to your arm and fingers. Follow these instructions at home: Managing pain  Take over-the-counter and prescription medicines only as told by your doctor.  If directed, put ice on the injured or painful area.  Put ice in a plastic bag.  Place a towel between your skin and the bag.  Leave the ice on for 20 minutes, 2-3 times per day.  If ice does not help, you can try using heat. Take a warm shower or warm bath, or use a heat pack as told by your doctor.  You may try a gentle neck and shoulder massage. Activity  Rest as needed. Follow instructions from your doctor about any activities to avoid.  Do exercises as told  by your doctor or physical therapist. General instructions  If you were given a soft collar, wear it as told by your doctor.  Use a flat pillow when you sleep.  Keep all follow-up visits as told by your doctor. This is important. Contact a doctor if:  Your condition does not improve with treatment. Get help right away if:  Your pain gets worse and is not controlled with medicine.  You lose feeling or feel weak in your hand, arm, face, or leg.  You have a fever.  You have a stiff neck.  You cannot control when you poop or pee (have incontinence).  You have trouble with walking, balance, or talking. This information is not intended to replace advice given to you by your health care provider. Make sure you discuss any questions you have with your health care provider. Document Released: 10/15/2011 Document Revised: 04/02/2016 Document Reviewed: 12/20/2014  2017 Elsevier

## 2016-10-20 ENCOUNTER — Encounter (HOSPITAL_COMMUNITY): Payer: Self-pay | Admitting: Family Medicine

## 2016-10-20 ENCOUNTER — Ambulatory Visit (HOSPITAL_COMMUNITY)
Admission: EM | Admit: 2016-10-20 | Discharge: 2016-10-20 | Disposition: A | Discharge status: Home / Self Care | Payer: 59 | Attending: Family Medicine | Admitting: Family Medicine

## 2016-10-20 DIAGNOSIS — M7581 Other shoulder lesions, right shoulder: Secondary | ICD-10-CM | POA: Diagnosis not present

## 2016-10-20 DIAGNOSIS — M778 Other enthesopathies, not elsewhere classified: Secondary | ICD-10-CM

## 2016-10-20 MED ORDER — DICLOFENAC POTASSIUM 50 MG PO TABS
50.0000 mg | ORAL_TABLET | Freq: Three times a day (TID) | ORAL | 0 refills | Status: DC
Start: 2016-10-20 — End: 2022-12-03

## 2016-10-20 NOTE — ED Triage Notes (Signed)
Pt here for right arm pain x 1 week. sts pain in right shoulder and radiating down right arm. sts sharp shooting pain and hard using right hand. Denies injury. sts she was seen at [facility] on Saturday and given meds and not getting any better.

## 2016-10-20 NOTE — ED Provider Notes (Signed)
[facility]    CSN: [id number] Arrival date & time: 10/20/16  1241     History   Chief Complaint Chief Complaint  Patient presents with  . Arm Pain    HPI Donna Larsen is a 60 y.o. female.   The history is provided by the patient.  Arm Pain  This is a new problem. The current episode started more than 1 week ago (seen 4 d ago at [facility] but no change in sx with meds.). The problem has not changed since onset.Pertinent negatives include no chest pain, no abdominal pain, no headaches and no shortness of breath. The symptoms are aggravated by bending.    Past Medical History:  Diagnosis Date  . Depression   . Hypertension   . Irregular heart beat   . Stroke ([place])   . Thyroid disease     Patient Active Problem List   Diagnosis Date Noted  . Chest pain 05/04/2014    Past Surgical History:  Procedure Laterality Date  . CESAREAN SECTION      OB History    No data available       Home Medications    Prior to Admission medications   Medication Sig Start Date End Date Taking? Authorizing Provider  aspirin 81 MG EC tablet Take 1 tablet (81 mg total) by mouth daily. Swallow whole. 05/04/14   Jennifer Piepenbrink, PA-C  cyclobenzaprine (FLEXERIL) 10 MG tablet Take 1 tablet (10 mg total) by mouth 3 (three) times daily as needed for muscle spasms. 10/17/16   Sedalia Muta, FNP  FLUoxetine (PROZAC) 20 MG capsule Take 1 tab daily 05/04/14   Historical Provider, MD  levothyroxine (SYNTHROID, LEVOTHROID) 75 MCG tablet Take 75 mcg by mouth daily before breakfast.    Historical Provider, MD  LORazepam (ATIVAN) 1 MG tablet As directed 05/15/14   Historical Provider, MD  metoprolol succinate (TOPROL-XL) 25 MG 24 hr tablet Take 25 mg by mouth daily.    Historical Provider, MD  Multiple Vitamin (MULTIVITAMIN WITH MINERALS) TABS tablet Take 1 tablet by mouth daily.    Historical Provider, MD  potassium chloride (K-DUR) 10 MEQ tablet Take 10 mEq by mouth  every other day.     Historical Provider, MD  predniSONE (DELTASONE) 20 MG tablet Take 2 tablets (40 mg total) by mouth daily with breakfast. 10/17/16   Sedalia Muta, FNP  traMADol (ULTRAM) 50 MG tablet Take 1 tablet (50 mg total) by mouth every 8 (eight) hours as needed. 10/17/16   Sedalia Muta, FNP  triamterene-hydrochlorothiazide (MAXZIDE) 75-50 MG per tablet Take 1 tablet by mouth daily.    Historical Provider, MD    Family History Family History  Problem Relation Age of Onset  . CAD Mother     started in her 45s  . Diabetes Mellitus II Mother   . Hyperlipidemia Mother   . CAD Father     started in his late 42s    Social History Social History  Substance Use Topics  . Smoking status: Never Smoker  . Smokeless tobacco: Never Used  . Alcohol use Yes     Comment: rarely drink once a month     Allergies   Patient has no known allergies.   Review of Systems Review of Systems  Constitutional: Negative.   HENT: Negative.   Respiratory: Negative for shortness of breath.   Cardiovascular: Negative for chest pain.  Gastrointestinal: Negative for abdominal pain.  Musculoskeletal: Negative for neck pain and neck  stiffness.  Skin: Negative.   Neurological: Negative.  Negative for weakness and headaches.     Physical Exam Triage Vital Signs ED Triage Vitals  Enc Vitals Group     BP 10/20/16 1335 177/100     Pulse Rate 10/20/16 1335 75     Resp 10/20/16 1335 17     Temp 10/20/16 1335 98.4 F (36.9 C)     Temp src --      SpO2 10/20/16 1335 100 %     Weight --      Height --      Head Circumference --      Peak Flow --      Pain Score 10/20/16 1336 8     Pain Loc --      Pain Edu? --      Excl. in [place]? --    No data found.   Updated Vital Signs BP 177/100   Pulse 75   Temp 98.4 F (36.9 C)   Resp 17   SpO2 100%   Visual Acuity Right Eye Distance:   Left Eye Distance:   Bilateral Distance:    Right Eye Near:   Left Eye Near:     Bilateral Near:     Physical Exam  Constitutional: She is oriented to person, place, and time. She appears well-developed and well-nourished. No distress.  HENT:  Right Ear: External ear normal.  Left Ear: External ear normal.  Nose: Nose normal.  Mouth/Throat: Oropharynx is clear and moist.  Eyes: Conjunctivae are normal. Pupils are equal, round, and reactive to light.  Neck: Normal range of motion. Neck supple.  Abdominal: Soft.  Musculoskeletal: Normal range of motion. She exhibits tenderness.       Right shoulder: She exhibits tenderness, pain and spasm. She exhibits normal range of motion, no bony tenderness, no swelling, no effusion, normal pulse and normal strength.  Lymphadenopathy:    She has no cervical adenopathy.  Neurological: She is alert and oriented to person, place, and time. No cranial nerve deficit.  Skin: Skin is warm and dry.     UC Treatments / Results  Labs (all labs ordered are listed, but only abnormal results are displayed) Labs Reviewed - No data to display  EKG  EKG Interpretation None       Radiology No results found.  Procedures Procedures (including critical care time)  Medications Ordered in UC Medications - No data to display   Initial Impression / Assessment and Plan / UC Course  I have reviewed the triage vital signs and the nursing notes.  Pertinent labs & imaging results that were available during my care of the patient were reviewed by me and considered in my medical decision making (see chart for details).  Clinical Course       Final Clinical Impressions(s) / UC Diagnoses   Final diagnoses:  None    New Prescriptions New Prescriptions   No medications on file     Billy Fischer, MD 10/20/16 1357

## 2016-10-20 NOTE — Discharge Instructions (Signed)
Heat and sling for comfort, use medicine as needed, return as needed

## 2016-10-30 ENCOUNTER — Ambulatory Visit
Admission: RE | Admit: 2016-10-30 | Discharge: 2016-10-30 | Disposition: A | Discharge status: Home / Self Care | Payer: 59 | Source: Ambulatory Visit | Attending: Physician Assistant | Admitting: Physician Assistant

## 2016-10-30 ENCOUNTER — Other Ambulatory Visit: Payer: Self-pay | Admitting: Physician Assistant

## 2016-10-30 DIAGNOSIS — M25511 Pain in right shoulder: Secondary | ICD-10-CM

## 2016-11-11 DIAGNOSIS — E039 Hypothyroidism, unspecified: Secondary | ICD-10-CM | POA: Diagnosis not present

## 2016-11-13 DIAGNOSIS — M50323 Other cervical disc degeneration at C6-C7 level: Secondary | ICD-10-CM | POA: Diagnosis not present

## 2016-11-13 DIAGNOSIS — M50322 Other cervical disc degeneration at C5-C6 level: Secondary | ICD-10-CM | POA: Diagnosis not present

## 2016-11-13 DIAGNOSIS — M50321 Other cervical disc degeneration at C4-C5 level: Secondary | ICD-10-CM | POA: Diagnosis not present

## 2016-11-13 DIAGNOSIS — M5412 Radiculopathy, cervical region: Secondary | ICD-10-CM | POA: Diagnosis not present

## 2016-11-30 DIAGNOSIS — M50122 Cervical disc disorder at C5-C6 level with radiculopathy: Secondary | ICD-10-CM | POA: Diagnosis not present

## 2016-11-30 DIAGNOSIS — M5030 Other cervical disc degeneration, unspecified cervical region: Secondary | ICD-10-CM | POA: Diagnosis not present

## 2016-11-30 DIAGNOSIS — M50123 Cervical disc disorder at C6-C7 level with radiculopathy: Secondary | ICD-10-CM | POA: Diagnosis not present

## 2016-12-02 ENCOUNTER — Other Ambulatory Visit: Payer: Self-pay | Admitting: Physical Medicine and Rehabilitation

## 2016-12-02 DIAGNOSIS — M5030 Other cervical disc degeneration, unspecified cervical region: Secondary | ICD-10-CM

## 2016-12-14 ENCOUNTER — Ambulatory Visit
Admission: RE | Admit: 2016-12-14 | Discharge: 2016-12-14 | Disposition: A | Discharge status: Home / Self Care | Payer: 59 | Source: Ambulatory Visit | Attending: Physical Medicine and Rehabilitation | Admitting: Physical Medicine and Rehabilitation

## 2016-12-14 DIAGNOSIS — M4802 Spinal stenosis, cervical region: Secondary | ICD-10-CM | POA: Diagnosis not present

## 2016-12-14 DIAGNOSIS — M5030 Other cervical disc degeneration, unspecified cervical region: Secondary | ICD-10-CM

## 2016-12-24 DIAGNOSIS — M5030 Other cervical disc degeneration, unspecified cervical region: Secondary | ICD-10-CM | POA: Diagnosis not present

## 2016-12-24 DIAGNOSIS — M50122 Cervical disc disorder at C5-C6 level with radiculopathy: Secondary | ICD-10-CM | POA: Diagnosis not present

## 2016-12-24 DIAGNOSIS — M50123 Cervical disc disorder at C6-C7 level with radiculopathy: Secondary | ICD-10-CM | POA: Diagnosis not present

## 2016-12-25 DIAGNOSIS — M5030 Other cervical disc degeneration, unspecified cervical region: Secondary | ICD-10-CM | POA: Diagnosis not present

## 2016-12-25 DIAGNOSIS — M50123 Cervical disc disorder at C6-C7 level with radiculopathy: Secondary | ICD-10-CM | POA: Diagnosis not present

## 2016-12-25 DIAGNOSIS — M50122 Cervical disc disorder at C5-C6 level with radiculopathy: Secondary | ICD-10-CM | POA: Diagnosis not present

## 2017-01-05 DIAGNOSIS — M50122 Cervical disc disorder at C5-C6 level with radiculopathy: Secondary | ICD-10-CM | POA: Diagnosis not present

## 2017-01-05 DIAGNOSIS — M5412 Radiculopathy, cervical region: Secondary | ICD-10-CM | POA: Diagnosis not present

## 2017-01-05 DIAGNOSIS — N951 Menopausal and female climacteric states: Secondary | ICD-10-CM | POA: Diagnosis not present

## 2017-01-08 ENCOUNTER — Other Ambulatory Visit: Payer: Self-pay | Admitting: Orthopedic Surgery

## 2017-01-08 DIAGNOSIS — R5381 Other malaise: Secondary | ICD-10-CM

## 2017-01-14 ENCOUNTER — Other Ambulatory Visit: Payer: Self-pay | Admitting: Orthopedic Surgery

## 2017-01-14 DIAGNOSIS — E2839 Other primary ovarian failure: Secondary | ICD-10-CM

## 2017-01-25 ENCOUNTER — Ambulatory Visit
Admission: RE | Admit: 2017-01-25 | Discharge: 2017-01-25 | Disposition: A | Discharge status: Home / Self Care | Payer: 59 | Source: Ambulatory Visit | Attending: Orthopedic Surgery | Admitting: Orthopedic Surgery

## 2017-01-25 DIAGNOSIS — E2839 Other primary ovarian failure: Secondary | ICD-10-CM

## 2017-01-25 DIAGNOSIS — Z13820 Encounter for screening for osteoporosis: Secondary | ICD-10-CM | POA: Diagnosis not present

## 2017-01-25 DIAGNOSIS — Z780 Asymptomatic menopausal state: Secondary | ICD-10-CM | POA: Diagnosis not present

## 2017-01-28 DIAGNOSIS — M50123 Cervical disc disorder at C6-C7 level with radiculopathy: Secondary | ICD-10-CM | POA: Diagnosis not present

## 2017-02-02 DIAGNOSIS — H43399 Other vitreous opacities, unspecified eye: Secondary | ICD-10-CM | POA: Diagnosis not present

## 2017-02-16 DIAGNOSIS — M50122 Cervical disc disorder at C5-C6 level with radiculopathy: Secondary | ICD-10-CM | POA: Diagnosis not present

## 2017-02-16 DIAGNOSIS — M50123 Cervical disc disorder at C6-C7 level with radiculopathy: Secondary | ICD-10-CM | POA: Diagnosis not present

## 2017-02-16 DIAGNOSIS — M50322 Other cervical disc degeneration at C5-C6 level: Secondary | ICD-10-CM | POA: Diagnosis not present

## 2017-02-19 DIAGNOSIS — M50123 Cervical disc disorder at C6-C7 level with radiculopathy: Secondary | ICD-10-CM | POA: Diagnosis not present

## 2017-02-19 DIAGNOSIS — M50223 Other cervical disc displacement at C6-C7 level: Secondary | ICD-10-CM | POA: Diagnosis not present

## 2017-03-31 DIAGNOSIS — Z0000 Encounter for general adult medical examination without abnormal findings: Secondary | ICD-10-CM | POA: Diagnosis not present

## 2017-03-31 DIAGNOSIS — E039 Hypothyroidism, unspecified: Secondary | ICD-10-CM | POA: Diagnosis not present

## 2017-03-31 DIAGNOSIS — K219 Gastro-esophageal reflux disease without esophagitis: Secondary | ICD-10-CM | POA: Diagnosis not present

## 2017-03-31 DIAGNOSIS — I10 Essential (primary) hypertension: Secondary | ICD-10-CM | POA: Diagnosis not present

## 2017-04-02 DIAGNOSIS — M50223 Other cervical disc displacement at C6-C7 level: Secondary | ICD-10-CM | POA: Diagnosis not present

## 2017-04-02 DIAGNOSIS — M50123 Cervical disc disorder at C6-C7 level with radiculopathy: Secondary | ICD-10-CM | POA: Diagnosis not present

## 2017-04-07 DIAGNOSIS — M5030 Other cervical disc degeneration, unspecified cervical region: Secondary | ICD-10-CM | POA: Diagnosis not present

## 2017-05-06 DIAGNOSIS — Z1211 Encounter for screening for malignant neoplasm of colon: Secondary | ICD-10-CM | POA: Diagnosis not present

## 2017-05-06 DIAGNOSIS — D122 Benign neoplasm of ascending colon: Secondary | ICD-10-CM | POA: Diagnosis not present

## 2017-05-06 DIAGNOSIS — K640 First degree hemorrhoids: Secondary | ICD-10-CM | POA: Diagnosis not present

## 2017-05-10 DIAGNOSIS — Z124 Encounter for screening for malignant neoplasm of cervix: Secondary | ICD-10-CM | POA: Diagnosis not present

## 2017-05-10 DIAGNOSIS — Z01411 Encounter for gynecological examination (general) (routine) with abnormal findings: Secondary | ICD-10-CM | POA: Diagnosis not present

## 2017-05-14 DIAGNOSIS — M50123 Cervical disc disorder at C6-C7 level with radiculopathy: Secondary | ICD-10-CM | POA: Diagnosis not present

## 2017-06-07 DIAGNOSIS — E038 Other specified hypothyroidism: Secondary | ICD-10-CM | POA: Diagnosis not present

## 2017-07-13 DIAGNOSIS — E7800 Pure hypercholesterolemia, unspecified: Secondary | ICD-10-CM | POA: Diagnosis not present

## 2017-11-17 DIAGNOSIS — E039 Hypothyroidism, unspecified: Secondary | ICD-10-CM | POA: Diagnosis not present

## 2017-11-17 DIAGNOSIS — Z23 Encounter for immunization: Secondary | ICD-10-CM | POA: Diagnosis not present

## 2017-11-17 DIAGNOSIS — I10 Essential (primary) hypertension: Secondary | ICD-10-CM | POA: Diagnosis not present

## 2018-01-13 DIAGNOSIS — B029 Zoster without complications: Secondary | ICD-10-CM | POA: Diagnosis not present

## 2018-02-01 DIAGNOSIS — B0229 Other postherpetic nervous system involvement: Secondary | ICD-10-CM | POA: Diagnosis not present

## 2018-02-01 DIAGNOSIS — I10 Essential (primary) hypertension: Secondary | ICD-10-CM | POA: Diagnosis not present

## 2018-02-01 DIAGNOSIS — E039 Hypothyroidism, unspecified: Secondary | ICD-10-CM | POA: Diagnosis not present

## 2018-04-20 DIAGNOSIS — J329 Chronic sinusitis, unspecified: Secondary | ICD-10-CM | POA: Diagnosis not present

## 2018-05-16 DIAGNOSIS — Z01411 Encounter for gynecological examination (general) (routine) with abnormal findings: Secondary | ICD-10-CM | POA: Diagnosis not present

## 2018-05-16 DIAGNOSIS — Z6841 Body Mass Index (BMI) 40.0 and over, adult: Secondary | ICD-10-CM | POA: Diagnosis not present

## 2018-05-16 DIAGNOSIS — N951 Menopausal and female climacteric states: Secondary | ICD-10-CM | POA: Diagnosis not present

## 2018-05-18 DIAGNOSIS — E7800 Pure hypercholesterolemia, unspecified: Secondary | ICD-10-CM | POA: Diagnosis not present

## 2018-05-18 DIAGNOSIS — E039 Hypothyroidism, unspecified: Secondary | ICD-10-CM | POA: Diagnosis not present

## 2018-05-18 DIAGNOSIS — K219 Gastro-esophageal reflux disease without esophagitis: Secondary | ICD-10-CM | POA: Diagnosis not present

## 2018-05-18 DIAGNOSIS — I10 Essential (primary) hypertension: Secondary | ICD-10-CM | POA: Diagnosis not present

## 2018-05-18 DIAGNOSIS — Z0000 Encounter for general adult medical examination without abnormal findings: Secondary | ICD-10-CM | POA: Diagnosis not present

## 2018-07-05 DIAGNOSIS — Z1231 Encounter for screening mammogram for malignant neoplasm of breast: Secondary | ICD-10-CM | POA: Diagnosis not present

## 2018-08-26 DIAGNOSIS — Z23 Encounter for immunization: Secondary | ICD-10-CM | POA: Diagnosis not present

## 2018-11-08 DIAGNOSIS — Z111 Encounter for screening for respiratory tuberculosis: Secondary | ICD-10-CM | POA: Diagnosis not present

## 2018-11-28 DIAGNOSIS — E039 Hypothyroidism, unspecified: Secondary | ICD-10-CM | POA: Diagnosis not present

## 2018-11-28 DIAGNOSIS — I10 Essential (primary) hypertension: Secondary | ICD-10-CM | POA: Diagnosis not present

## 2018-11-28 DIAGNOSIS — E7800 Pure hypercholesterolemia, unspecified: Secondary | ICD-10-CM | POA: Diagnosis not present

## 2019-08-24 ENCOUNTER — Encounter: Payer: Self-pay | Admitting: *Deleted

## 2020-01-17 ENCOUNTER — Other Ambulatory Visit: Payer: Self-pay

## 2020-01-17 ENCOUNTER — Ambulatory Visit (INDEPENDENT_AMBULATORY_CARE_PROVIDER_SITE_OTHER): Payer: 59 | Admitting: Otolaryngology

## 2020-01-17 ENCOUNTER — Encounter (INDEPENDENT_AMBULATORY_CARE_PROVIDER_SITE_OTHER): Payer: Self-pay | Admitting: Otolaryngology

## 2020-01-17 VITALS — Temp 97.3°F

## 2020-01-17 DIAGNOSIS — H6123 Impacted cerumen, bilateral: Secondary | ICD-10-CM | POA: Diagnosis not present

## 2020-01-17 NOTE — Progress Notes (Signed)
HPI: Donna Larsen is a 64 y.o. female who presents for evaluation of wax buildup in the ears.  Donna Larsen feels like there is water in both ears although it is little bit better today..  Past Medical History:  Diagnosis Date  . Depression   . Hypertension   . Irregular heart beat   . Stroke ([place])   . Thyroid disease    Past Surgical History:  Procedure Laterality Date  . CESAREAN SECTION     Social History   Socioeconomic History  . Marital status: Married    Spouse name: Not on file  . Number of children: Not on file  . Years of education: Not on file  . Highest education level: Not on file  Occupational History  . Not on file  Tobacco Use  . Smoking status: Never Smoker  . Smokeless tobacco: Never Used  Substance and Sexual Activity  . Alcohol use: Yes    Comment: rarely drink once a month  . Drug use: No    Comment: denies any illicit drug use  . Sexual activity: Not on file  Other Topics Concern  . Not on file  Social History Narrative  . Not on file   Social Determinants of Health   Financial Resource Strain:   . Difficulty of Paying Living Expenses: Not on file  Food Insecurity:   . Worried About Charity fundraiser in the Last Year: Not on file  . Ran Out of Food in the Last Year: Not on file  Transportation Needs:   . Lack of Transportation (Medical): Not on file  . Lack of Transportation (Non-Medical): Not on file  Physical Activity:   . Days of Exercise per Week: Not on file  . Minutes of Exercise per Session: Not on file  Stress:   . Feeling of Stress : Not on file  Social Connections:   . Frequency of Communication with Friends and Family: Not on file  . Frequency of Social Gatherings with Friends and Family: Not on file  . Attends Religious Services: Not on file  . Active Member of Clubs or Organizations: Not on file  . Attends Archivist Meetings: Not on file  . Marital Status: Not on file   Family History  Problem Relation Age  of Onset  . CAD Mother        started in her 62s  . Diabetes Mellitus II Mother   . Hyperlipidemia Mother   . CAD Father        started in his late 20s   No Known Allergies Prior to Admission medications   Medication Sig Start Date End Date Taking? Authorizing Provider  aspirin 81 MG EC tablet Take 1 tablet (81 mg total) by mouth daily. Swallow whole. 05/04/14  Yes Piepenbrink, Anderson Malta, PA-C  cyclobenzaprine (FLEXERIL) 10 MG tablet Take 1 tablet (10 mg total) by mouth 3 (three) times daily as needed for muscle spasms. 10/17/16  Yes Scot Jun, FNP  diclofenac (CATAFLAM) 50 MG tablet Take 1 tablet (50 mg total) by mouth 3 (three) times daily. 10/20/16  Yes Billy Fischer, MD  FLUoxetine (PROZAC) 20 MG capsule Take 1 tab daily 05/04/14  Yes [provider]  levothyroxine (SYNTHROID, LEVOTHROID) 75 MCG tablet Take 75 mcg by mouth daily before breakfast.   Yes [provider]  LORazepam (ATIVAN) 1 MG tablet As directed 05/15/14  Yes [provider]  metoprolol succinate (TOPROL-XL) 25 MG 24 hr tablet Take 25 mg  by mouth daily.   Yes [provider]  Multiple Vitamin (MULTIVITAMIN WITH MINERALS) TABS tablet Take 1 tablet by mouth daily.   Yes [provider]  potassium chloride (K-DUR) 10 MEQ tablet Take 10 mEq by mouth every other day.    Yes [provider]  traMADol (ULTRAM) 50 MG tablet Take 1 tablet (50 mg total) by mouth every 8 (eight) hours as needed. 10/17/16  Yes Scot Jun, FNP  triamterene-hydrochlorothiazide (MAXZIDE) 75-50 MG per tablet Take 1 tablet by mouth daily.   Yes [provider]  predniSONE (DELTASONE) 20 MG tablet Take 2 tablets (40 mg total) by mouth daily with breakfast. Patient not taking: Reported on 01/17/2020 10/17/16   Scot Jun, FNP     Positive ROS: Otherwise negative  All other systems have been reviewed and were otherwise negative with the exception of those mentioned in the HPI  and as above.  Physical Exam: Constitutional: Alert, well-appearing, no acute distress Ears: External ears without lesions or tenderness. Ear canals with a large amount of wax on both sides right side slightly worse than left.  This was cleaned with curettes and suction.  TMs were clear bilaterally.. Nasal: External nose without lesions. Clear nasal passages Oral: Oropharynx clear. Neck: No palpable adenopathy or masses Respiratory: Breathing comfortably  Skin: No facial/neck lesions or rash noted.  Cerumen impaction removal  Date/Time: 01/17/2020 2:21 PM Performed by: Rozetta Nunnery, MD Authorized by: Rozetta Nunnery, MD   Consent:    Consent obtained:  Verbal   Consent given by:  Patient   Risks discussed:  Pain and bleeding Procedure details:    Location:  L ear and R ear   Procedure type: curette and suction   Post-procedure details:    Inspection:  TM intact and canal normal   Hearing quality:  Improved   Patient tolerance of procedure:  Tolerated well, no immediate complications Comments:     TMs are clear bilaterally    Assessment: Bilateral cerumen impactions  Plan: She will follow-up as needed  Radene Journey, MD

## 2021-03-18 ENCOUNTER — Other Ambulatory Visit: Payer: Self-pay

## 2021-03-18 ENCOUNTER — Encounter: Payer: Self-pay | Admitting: Radiology

## 2021-03-18 ENCOUNTER — Ambulatory Visit: Payer: 59

## 2021-03-18 ENCOUNTER — Ambulatory Visit (INDEPENDENT_AMBULATORY_CARE_PROVIDER_SITE_OTHER): Payer: 59 | Admitting: Cardiology

## 2021-03-18 ENCOUNTER — Encounter: Payer: Self-pay | Admitting: *Deleted

## 2021-03-18 VITALS — BP 150/100 | HR 72 | Ht 64.0 in | Wt 263.0 lb

## 2021-03-18 DIAGNOSIS — R072 Precordial pain: Secondary | ICD-10-CM

## 2021-03-18 DIAGNOSIS — R9431 Abnormal electrocardiogram [ECG] [EKG]: Secondary | ICD-10-CM

## 2021-03-18 DIAGNOSIS — R002 Palpitations: Secondary | ICD-10-CM

## 2021-03-18 NOTE — Patient Instructions (Addendum)
Medication Instructions:  The current medical regimen is effective;  continue present plan and medications.  *If you need a refill on your cardiac medications before your next appointment, please call your pharmacy*  Testing/Procedures: Your physician has requested that you have an echocardiogram. Echocardiography is a painless test that uses sound waves to create images of your heart. It provides your doctor with information about the size and shape of your heart and how well your heart's chambers and valves are working. This procedure takes approximately one hour. There are no restrictions for this procedure.  Your physician has requested that you have a lexiscan myoview. For further information please visit [email or web address]. Please follow instruction sheet, as given.  ZIO XT- Long Term Monitor Instructions   Your physician has requested you wear a ZIO patch monitor for 14 days.  This is a single patch monitor.   [employer] supplies one patch monitor per enrollment. Additional stickers are not available. Please do not apply patch if you will be having a Nuclear Stress Test, Echocardiogram, Cardiac CT, MRI, or Chest Xray during the period you would be wearing the monitor. The patch cannot be worn during these tests. You cannot remove and re-apply the ZIO XT patch monitor.  Your ZIO patch monitor will be sent Fed Ex from [employer] directly to your home address. It may take 3-5 days to receive your monitor after you have been enrolled.  Once you have received your monitor, please review the enclosed instructions. Your monitor has already been registered assigning a specific monitor serial # to you.  Billing and Patient Assistance Program Information   We have supplied [employer] with any of your insurance information on file for billing purposes. [employer] offers a sliding scale Patient Assistance Program for patients that do not have insurance, or whose insurance does not completely cover  the cost of the ZIO monitor.   You must apply for the Patient Assistance Program to qualify for this discounted rate.     To apply, please call [employer] at [phone], select option 4, then select option 2, and ask to apply for Patient Assistance Program.  Donna Larsen will ask your household income, and how many people are in your household.  They will quote your out-of-pocket cost based on that information.  [employer] will also be able to set up a 66-month, interest-free payment plan if needed.  Applying the monitor   Shave hair from upper left chest.  Hold abrader disc by orange tab. Rub abrader in 40 strokes over the upper left chest as indicated in your monitor instructions.  Clean area with 4 enclosed alcohol pads. Let dry.  Apply patch as indicated in monitor instructions. Patch will be placed under collarbone on left side of chest with arrow pointing upward.  Rub patch adhesive wings for 2 minutes. Remove white label marked "1". Remove the white label marked "2". Rub patch adhesive wings for 2 additional minutes.  While looking in a mirror, press and release button in center of patch. A small green light will flash 3-4 times. This will be your only indicator that the monitor has been turned on. ?  Do not shower for the first 24 hours. You may shower after the first 24 hours.  Press the button if you feel a symptom. You will hear a small click. Record Date, Time and Symptom in the Patient Logbook.  When you are ready to remove the patch, follow instructions on the last 2 pages of the Patient Logbook. Stick patch monitor  onto the last page of Patient Logbook.  Place Patient Logbook in the blue and white box.  Use locking tab on box and tape box closed securely.  The blue and white box has prepaid postage on it. Please place it in the mailbox as soon as possible. Your physician should have your test results approximately 7 days after the monitor has been mailed back to [facility].  Call [place] at [phone] if you have questions regarding your ZIO XT patch monitor. Call them immediately if you see an orange light blinking on your monitor.  If your monitor falls off in less than 4 days, contact our Monitor department at [phone]. ?If your monitor becomes loose or falls off after 4 days call [employer] at [phone] for suggestions on securing your monitor.?  Follow-Up: At [facility], you and your health needs are our priority.  As part of our continuing mission to provide you with exceptional heart care, we have created designated Provider Care Teams.  These Care Teams include your primary Cardiologist (physician) and Advanced Practice Providers (APPs -  Physician Assistants and Nurse Practitioners) who all work together to provide you with the care you need, when you need it.  We recommend signing up for the patient portal called "MyChart".  Sign up information is provided on this After Visit Summary.  MyChart is used to connect with patients for Virtual Visits (Telemedicine).  Patients are able to view lab/test results, encounter notes, upcoming appointments, etc.  Non-urgent messages can be sent to your provider as well.   To learn more about what you can do with MyChart, go to [email or web address].    Your next appointment:   Follow up with Dr Marlou Porch as needed after the above testing.  Thank you for choosing [facility]!!

## 2021-03-18 NOTE — Progress Notes (Signed)
Enrolled patient for a 14 day Zio XT  monitor to be mailed to patients home  °

## 2021-03-18 NOTE — Progress Notes (Signed)
Cardiology Office Note:    Date:  03/18/2021   ID:  Donna Larsen, DOB 03-08-1956, MRN [id number]  PCP:  Kelton Pillar, MD   [facility] Providers Cardiologist:  Candee Furbish, MD     Referring MD: Kelton Pillar, MD     History of Present Illness:    Donna Larsen is a 65 y.o. female here for the evaluation chest pressure, palpitations with history of hypertension stroke depression hypothyroidism and irregular heartbeat.  Previously seen in ER by Dr. Haroldine Laws several years ago.  She is also seeing me also for irregular heartbeat.  Been having chest palpitations, fast HR. Sit down and it goes away. Nurses at work checked her and thought she was OK. Mostly happens when rushing or eating sweet food.  6 months ago gout med stopped and feels less frequent. Prior daily. Only one gout flair.   Skips beat then a pressure. Takes asa. Goes away. Can happen at night. 10 minutes.   In the past she had underwent a Lexiscan Myoview without any ischemia.  Normal EF 64%.  Has a family history of CAD.    Past Medical History:  Diagnosis Date  . Allergies   . Anxiety   . Depression   . GERD (gastroesophageal reflux disease)   . Gout   . Hypertension   . Hypothyroidism   . Irregular heart beat   . Obesity   . Prediabetes   . Shingles   . Stroke ([place])   . Thyroid disease     Past Surgical History:  Procedure Laterality Date  . CESAREAN SECTION      Current Medications: Current Meds  Medication Sig  . aspirin 81 MG EC tablet Take 1 tablet (81 mg total) by mouth daily. Swallow whole. (Patient taking differently: Take 81 mg by mouth every other day. Swallow whole.)  . atorvastatin (LIPITOR) 20 MG tablet Take 1 tablet by mouth daily.  . cyclobenzaprine (FLEXERIL) 10 MG tablet Take 1 tablet (10 mg total) by mouth 3 (three) times daily as needed for muscle spasms.  . diclofenac (CATAFLAM) 50 MG tablet Take 1 tablet (50 mg total) by mouth 3 (three) times daily.  Marland Kitchen FLUoxetine  (PROZAC) 20 MG capsule Take 1 tab daily  . levothyroxine (SYNTHROID) 125 MCG tablet Take 125 mcg by mouth daily.  Marland Kitchen LORazepam (ATIVAN) 1 MG tablet As directed  . metoprolol succinate (TOPROL-XL) 25 MG 24 hr tablet Take 25 mg by mouth daily.  . Multiple Vitamin (MULTIVITAMIN WITH MINERALS) TABS tablet Take 1 tablet by mouth daily.  . potassium chloride (K-DUR) 10 MEQ tablet Take 10 mEq by mouth every other day.   . triamterene-hydrochlorothiazide (MAXZIDE) 75-50 MG per tablet Take 1 tablet by mouth daily.     Allergies:   Patient has no known allergies.   Social History   Socioeconomic History  . Marital status: Married    Spouse name: Not on file  . Number of children: Not on file  . Years of education: Not on file  . Highest education level: Not on file  Occupational History  . Not on file  Tobacco Use  . Smoking status: Never Smoker  . Smokeless tobacco: Never Used  Substance and Sexual Activity  . Alcohol use: Yes    Comment: rarely drink once a month  . Drug use: No    Comment: denies any illicit drug use  . Sexual activity: Not on file  Other Topics Concern  . Not on file  Social  History Narrative  . Not on file   Social Determinants of Health   Financial Resource Strain: Not on file  Food Insecurity: Not on file  Transportation Needs: Not on file  Physical Activity: Not on file  Stress: Not on file  Social Connections: Not on file     Family History: The patient's family history includes CAD in her father and mother; Diabetes Mellitus II in her mother; Hyperlipidemia in her mother.  ROS:   Please see the history of present illness.    No fevers chills nausea vomiting syncope bleeding no orthopnea all other systems reviewed and are negative.  EKGs/Labs/Other Studies Reviewed:      EKG:  EKG is  ordered today.  The ekg ordered today demonstrates sinus rhythm 72 nonspecific ST-T wave changes with T wave inversion subtle in V3 through V5  Recent Labs: No  results found for requested labs within last 8760 hours.  Recent Lipid Panel No results found for: CHOL, TRIG, HDL, CHOLHDL, VLDL, LDLCALC, LDLDIRECT   Risk Assessment/Calculations:      Physical Exam:    VS:  BP (!) 150/100 (BP Location: Left Arm, Patient Position: Sitting, Cuff Size: Normal)   Pulse 72   Ht 5\' 4"  (1.626 m)   Wt 263 lb (119.3 kg)   BMI 45.14 kg/m     Wt Readings from Last 3 Encounters:  03/18/21 263 lb (119.3 kg)  10/17/16 253 lb (114.8 kg)  06/01/14 236 lb (107 kg)     GEN:  Well nourished, well developed in no acute distress HEENT: Normal NECK: No JVD; No carotid bruits LYMPHATICS: No lymphadenopathy CARDIAC: RRR, no murmurs, rubs, gallops RESPIRATORY:  Clear to auscultation without rales, wheezing or rhonchi  ABDOMEN: Soft, non-tender, non-distended MUSCULOSKELETAL:  No edema; No deformity  SKIN: Warm and dry NEUROLOGIC:  Alert and oriented x 3 PSYCHIATRIC:  Normal affect   ASSESSMENT:    1. Palpitations   2. Precordial pain   3. Nonspecific abnormal electrocardiogram (ECG) (EKG)    PLAN:    In order of problems listed above:  Chest discomfort/palpitations/abnormal EKG - Nonspecific ST-T wave changes on ECG.  We will go ahead and check an echocardiogram.  I will check a pharmacologic stress test. --Zio patch monitor  Essential hypertension - Blood pressure elevated today's visit however usually at home it is in the 381O systolic.  Degree of whitecoat hypertension.  Continue to monitor.  She is on Maxide as well as Toprol.  Maxide, triamterene hydrochlorothiazide.  Morbid obesity - Continue to encourage weight loss.  Weight has been gained during [place].  Continue to encourage exercise and diet.  LDL 69 HDL 47 triglycerides 103, hemoglobin A1c 5.7 hemoglobin 12.3 creatinine 0.7 TSH 2.6  Shared Decision Making/Informed Consent The risks [chest pain, shortness of breath, cardiac arrhythmias, dizziness, blood pressure fluctuations, myocardial  infarction, stroke/transient ischemic attack, nausea, vomiting, allergic reaction, radiation exposure, metallic taste sensation and life-threatening complications (estimated to be 1 in 10,000)], benefits (risk stratification, diagnosing coronary artery disease, treatment guidance) and alternatives of a nuclear stress test were discussed in detail with Ms. Sturgeon and she agrees to proceed.       Medication Adjustments/Labs and Tests Ordered: Current medicines are reviewed at length with the patient today.  Concerns regarding medicines are outlined above.  Orders Placed This Encounter  Procedures  . Cardiac Stress Test: Informed Consent Details: Physician/Practitioner Attestation; Transcribe to consent form and obtain patient signature  . MYOCARDIAL PERFUSION IMAGING  . LONG TERM MONITOR (  3-14 DAYS)  . EKG 12-Lead  . ECHOCARDIOGRAM COMPLETE   No orders of the defined types were placed in this encounter.   Patient Instructions  Medication Instructions:  The current medical regimen is effective;  continue present plan and medications.  *If you need a refill on your cardiac medications before your next appointment, please call your pharmacy*  Testing/Procedures: Your physician has requested that you have an echocardiogram. Echocardiography is a painless test that uses sound waves to create images of your heart. It provides your doctor with information about the size and shape of your heart and how well your heart's chambers and valves are working. This procedure takes approximately one hour. There are no restrictions for this procedure.  Your physician has requested that you have a lexiscan myoview. For further information please visit [email or web address]. Please follow instruction sheet, as given.  ZIO XT- Long Term Monitor Instructions   Your physician has requested you wear a ZIO patch monitor for 14 days.  This is a single patch monitor.   [employer] supplies one patch monitor per  enrollment. Additional stickers are not available. Please do not apply patch if you will be having a Nuclear Stress Test, Echocardiogram, Cardiac CT, MRI, or Chest Xray during the period you would be wearing the monitor. The patch cannot be worn during these tests. You cannot remove and re-apply the ZIO XT patch monitor.  Your ZIO patch monitor will be sent Fed Ex from [employer] directly to your home address. It may take 3-5 days to receive your monitor after you have been enrolled.  Once you have received your monitor, please review the enclosed instructions. Your monitor has already been registered assigning a specific monitor serial # to you.  Billing and Patient Assistance Program Information   We have supplied [employer] with any of your insurance information on file for billing purposes. [employer] offers a sliding scale Patient Assistance Program for patients that do not have insurance, or whose insurance does not completely cover the cost of the ZIO monitor.   You must apply for the Patient Assistance Program to qualify for this discounted rate.     To apply, please call [employer] at [phone], select option 4, then select option 2, and ask to apply for Patient Assistance Program.  Theodore Demark will ask your household income, and how many people are in your household.  They will quote your out-of-pocket cost based on that information.  [employer] will also be able to set up a 60-month, interest-free payment plan if needed.  Applying the monitor   Shave hair from upper left chest.  Hold abrader disc by orange tab. Rub abrader in 40 strokes over the upper left chest as indicated in your monitor instructions.  Clean area with 4 enclosed alcohol pads. Let dry.  Apply patch as indicated in monitor instructions. Patch will be placed under collarbone on left side of chest with arrow pointing upward.  Rub patch adhesive wings for 2 minutes. Remove white label marked "1". Remove the white label marked  "2". Rub patch adhesive wings for 2 additional minutes.  While looking in a mirror, press and release button in center of patch. A small green light will flash 3-4 times. This will be your only indicator that the monitor has been turned on. ?  Do not shower for the first 24 hours. You may shower after the first 24 hours.  Press the button if you feel a symptom. You will hear a small click. Record Date, Time  and Symptom in the Patient Logbook.  When you are ready to remove the patch, follow instructions on the last 2 pages of the Patient Logbook. Stick patch monitor onto the last page of Patient Logbook.  Place Patient Logbook in the blue and white box.  Use locking tab on box and tape box closed securely.  The blue and white box has prepaid postage on it. Please place it in the mailbox as soon as possible. Your physician should have your test results approximately 7 days after the monitor has been mailed back to [facility].  Call [place] at [phone] if you have questions regarding your ZIO XT patch monitor. Call them immediately if you see an orange light blinking on your monitor.  If your monitor falls off in less than 4 days, contact our Monitor department at [phone]. ?If your monitor becomes loose or falls off after 4 days call [employer] at [phone] for suggestions on securing your monitor.?  Follow-Up: At [facility], you and your health needs are our priority.  As part of our continuing mission to provide you with exceptional heart care, we have created designated Provider Care Teams.  These Care Teams include your primary Cardiologist (physician) and Advanced Practice Providers (APPs -  Physician Assistants and Nurse Practitioners) who all work together to provide you with the care you need, when you need it.  We recommend signing up for the patient portal called "MyChart".  Sign up information is provided on this After Visit Summary.  MyChart is used to  connect with patients for Virtual Visits (Telemedicine).  Patients are able to view lab/test results, encounter notes, upcoming appointments, etc.  Non-urgent messages can be sent to your provider as well.   To learn more about what you can do with MyChart, go to [email or web address].    Your next appointment:   Follow up with Dr Marlou Porch as needed after the above testing.  Thank you for choosing [facility]!!        Signed, Candee Furbish, MD  03/18/2021 3:25 PM    [place]

## 2021-04-09 ENCOUNTER — Telehealth (HOSPITAL_COMMUNITY): Payer: Self-pay | Admitting: *Deleted

## 2021-04-09 ENCOUNTER — Encounter (HOSPITAL_COMMUNITY): Payer: Self-pay | Admitting: *Deleted

## 2021-04-09 NOTE — Telephone Encounter (Signed)
Patient given detailed instructions per Myocardial Perfusion Study Information Sheet for the test on 04/16/21 at 0815. Patient notified to arrive 15 minutes early and that it is imperative to arrive on time for appointment to keep from having the test rescheduled.  If you need to cancel or reschedule your appointment, please call the office within 24 hours of your appointment. . Patient verbalized understanding.Dennis Mychart letter sent with instructions

## 2021-04-14 ENCOUNTER — Ambulatory Visit (HOSPITAL_COMMUNITY): Payer: 59

## 2021-04-15 ENCOUNTER — Ambulatory Visit (HOSPITAL_COMMUNITY): Payer: 59

## 2021-04-16 ENCOUNTER — Ambulatory Visit (HOSPITAL_COMMUNITY): Payer: 59 | Attending: Cardiology

## 2021-04-16 ENCOUNTER — Other Ambulatory Visit: Payer: Self-pay

## 2021-04-16 DIAGNOSIS — R072 Precordial pain: Secondary | ICD-10-CM | POA: Insufficient documentation

## 2021-04-16 DIAGNOSIS — R9431 Abnormal electrocardiogram [ECG] [EKG]: Secondary | ICD-10-CM | POA: Diagnosis present

## 2021-04-16 DIAGNOSIS — R002 Palpitations: Secondary | ICD-10-CM | POA: Diagnosis not present

## 2021-04-16 MED ORDER — REGADENOSON 0.4 MG/5ML IV SOLN
0.4000 mg | Freq: Once | INTRAVENOUS | Status: AC
  Administered 2021-04-16: 0.4 mg via INTRAVENOUS

## 2021-04-16 MED ORDER — TECHNETIUM TC 99M TETROFOSMIN IV KIT
32.5000 | PACK | Freq: Once | INTRAVENOUS | Status: AC | PRN
  Administered 2021-04-16: 32.5 via INTRAVENOUS
  Filled 2021-04-16: qty 33

## 2021-04-17 ENCOUNTER — Ambulatory Visit (HOSPITAL_COMMUNITY): Payer: 59 | Attending: Internal Medicine

## 2021-04-17 ENCOUNTER — Ambulatory Visit (HOSPITAL_BASED_OUTPATIENT_CLINIC_OR_DEPARTMENT_OTHER): Payer: 59

## 2021-04-17 DIAGNOSIS — R9431 Abnormal electrocardiogram [ECG] [EKG]: Secondary | ICD-10-CM | POA: Insufficient documentation

## 2021-04-17 DIAGNOSIS — R002 Palpitations: Secondary | ICD-10-CM | POA: Diagnosis present

## 2021-04-17 DIAGNOSIS — R072 Precordial pain: Secondary | ICD-10-CM

## 2021-04-17 LAB — MYOCARDIAL PERFUSION IMAGING
LV dias vol: 84 mL (ref 46–106)
LV sys vol: 35 mL
Peak HR: 82 {beats}/min
Rest HR: 68 {beats}/min
SDS: 3
SRS: 2
SSS: 5
TID: 0.84

## 2021-04-17 LAB — ECHOCARDIOGRAM COMPLETE
Area-P 1/2: 4.06 cm2
S' Lateral: 2.3 cm

## 2021-04-17 MED ORDER — TECHNETIUM TC 99M TETROFOSMIN IV KIT
29.3000 | PACK | Freq: Once | INTRAVENOUS | Status: AC | PRN
Start: 2021-04-17 — End: 2021-04-17
  Administered 2021-04-17: 29.3 via INTRAVENOUS
  Filled 2021-04-17: qty 30

## 2022-12-03 ENCOUNTER — Ambulatory Visit (INDEPENDENT_AMBULATORY_CARE_PROVIDER_SITE_OTHER): Payer: Medicare Other

## 2022-12-03 ENCOUNTER — Encounter: Payer: Self-pay | Admitting: Cardiology

## 2022-12-03 ENCOUNTER — Ambulatory Visit: Payer: Medicare Other | Attending: Cardiology | Admitting: Cardiology

## 2022-12-03 VITALS — BP 160/100 | HR 70 | Ht 64.0 in | Wt 272.2 lb

## 2022-12-03 DIAGNOSIS — I517 Cardiomegaly: Secondary | ICD-10-CM | POA: Diagnosis not present

## 2022-12-03 DIAGNOSIS — R002 Palpitations: Secondary | ICD-10-CM | POA: Diagnosis not present

## 2022-12-03 MED ORDER — AMLODIPINE BESYLATE 5 MG PO TABS: 5.0000 mg | ORAL_TABLET | Freq: Every day | ORAL | 3 refills | Status: DC

## 2022-12-03 NOTE — Patient Instructions (Addendum)
Medication Instructions:  START Amlodipine 5 mg daily   *If you need a refill on your cardiac medications before your next appointment, please call your pharmacy*   Testing/Procedures:  [place] Monitor Instructions  Your physician has requested you wear a ZIO patch monitor for 14 days.  This is a single patch monitor. [employer] supplies one patch monitor per enrollment. Additional stickers are not available. Please do not apply patch if you will be having a Nuclear Stress Test,  Echocardiogram, Cardiac CT, MRI, or Chest Xray during the period you would be wearing the  monitor. The patch cannot be worn during these tests. You cannot remove and re-apply the  ZIO XT patch monitor.  Your ZIO patch monitor will be mailed 3 day USPS to your address on file. It may take 3-5 days  to receive your monitor after you have been enrolled.  Once you have received your monitor, please review the enclosed instructions. Your monitor  has already been registered assigning a specific monitor serial # to you.  Billing and Patient Assistance Program Information  We have supplied [employer] with any of your insurance information on file for billing purposes. [employer] offers a sliding scale Patient Assistance Program for patients that do not have  insurance, or whose insurance does not completely cover the cost of the ZIO monitor.  You must apply for the Patient Assistance Program to qualify for this discounted rate.  To apply, please call [employer] at [phone], select option 4, select option 2, ask to apply for  Patient Assistance Program. Theodore Demark will ask your household income, and how many people  are in your household. They will quote your out-of-pocket cost based on that information.  [employer] will also be able to set up a 31-month interest-free payment plan if needed.  Applying the monitor   Shave hair from upper left chest.  Hold abrader disc by orange tab. Rub abrader in 40 strokes over the  upper left chest as  indicated in your monitor instructions.  Clean area with 4 enclosed alcohol pads. Let dry.  Apply patch as indicated in monitor instructions. Patch will be placed under collarbone on left  side of chest with arrow pointing upward.  Rub patch adhesive wings for 2 minutes. Remove white label marked "1". Remove the white  label marked "2". Rub patch adhesive wings for 2 additional minutes.  While looking in a mirror, press and release button in center of patch. A small green light will  flash 3-4 times. This will be your only indicator that the monitor has been turned on.  Do not shower for the first 24 hours. You may shower after the first 24 hours.  Press the button if you feel a symptom. You will hear a small click. Record Date, Time and  Symptom in the Patient Logbook.  When you are ready to remove the patch, follow instructions on the last 2 pages of Patient  Logbook. Stick patch monitor onto the last page of Patient Logbook.  Place Patient Logbook in the blue and white box. Use locking tab on box and tape box closed  securely. The blue and white box has prepaid postage on it. Please place it in the mailbox as  soon as possible. Your physician should have your test results approximately 7 days after the  monitor has been [facility]  Call [place]at [phone] you have questions regarding  your ZIO XT patch monitor. Call them immediately  if you see an orange light blinking on your  monitor.  If your monitor falls off in less than 4 days, contact our Monitor department at [phone].  If your monitor becomes loose or falls off after 4 days call [employer] at [phone] for  suggestions on securing your monitor    Follow-Up: At [facility], you and your health needs are our priority.  As part of our continuing mission to provide you with exceptional heart care, we have created designated Provider Care Teams.   These Care Teams include your primary Cardiologist (physician) and Advanced Practice Providers (APPs -  Physician Assistants and Nurse Practitioners) who all work together to provide you with the care you need, when you need it.  We recommend signing up for the patient portal called "MyChart".  Sign up information is provided on this After Visit Summary.  MyChart is used to connect with patients for Virtual Visits (Telemedicine).  Patients are able to view lab/test results, encounter notes, upcoming appointments, etc.  Non-urgent messages can be sent to your provider as well.   To learn more about what you can do with MyChart, go to [email or web address].    Your next appointment:   12 week(s)  Provider:   Dr.Kardie Tobb   Other Instructions -Take your blood pressure for 2 weeks - let us know via MyChart those readings.   -If you have symptoms, please log what you ate for that day along with symptoms occurring.

## 2022-12-03 NOTE — Progress Notes (Signed)
Cardiology Office Note:    Date:  12/03/2022   ID:  Donna Larsen, DOB 12/16/1955, MRN [id number]  PCP:  Kelton Pillar, MD  Cardiologist:  Candee Furbish, MD  Electrophysiologist:  None   Referring MD: Kelton Pillar, MD   " I am having palpitation"  History of Present Illness:    Donna Larsen is a 67 y.o. female with a hx of hypertension, stroke depression, hypothyroidism.  Previously followed with Dr. Candee Furbish will last seen in 2022.  She tells me that she has had intermittent palpitations.  She was unable to wear the monitor.  She notes that the frequency of the palpitations are increasing more and more.  No other complaints at this time.   Past Medical History:  Diagnosis Date   Allergies    Anxiety    Depression    GERD (gastroesophageal reflux disease)    Gout    Hypertension    Hypothyroidism    Irregular heart beat    Obesity    Prediabetes    Shingles    Stroke ([place])    Thyroid disease     Past Surgical History:  Procedure Laterality Date   CESAREAN SECTION      Current Medications: Current Meds  Medication Sig   allopurinol (ZYLOPRIM) 100 MG tablet Take 100 mg by mouth daily.   amLODipine (NORVASC) 5 MG tablet Take 1 tablet (5 mg total) by mouth daily.   aspirin 81 MG EC tablet Take 1 tablet (81 mg total) by mouth daily. Swallow whole. (Patient taking differently: Take 81 mg by mouth every other day. Swallow whole.)   atorvastatin (LIPITOR) 20 MG tablet Take 1 tablet by mouth daily.   cyclobenzaprine (FLEXERIL) 10 MG tablet Take 1 tablet (10 mg total) by mouth 3 (three) times daily as needed for muscle spasms.   FLUoxetine (PROZAC) 20 MG capsule Take 1 tab daily   levonorgestrel (KYLEENA) 19.5 MG IUD Take 1 device by intrauterine route.   levothyroxine (SYNTHROID) 125 MCG tablet Take 125 mcg by mouth daily.   LORazepam (ATIVAN) 1 MG tablet As directed   metoprolol succinate (TOPROL-XL) 25 MG 24 hr tablet Take 25 mg by mouth daily.   Multiple  Vitamins-Minerals (CENTRUM SILVER ADULT 50+) TABS 1 tablet Orally once a day   potassium chloride (K-DUR) 10 MEQ tablet Take 10 mEq by mouth every other day.    triamterene-hydrochlorothiazide (MAXZIDE) 75-50 MG per tablet Take 1 tablet by mouth daily.     Allergies:   Patient has no known allergies.   Social History   Socioeconomic History   Marital status: Married    Spouse name: Not on file   Number of children: Not on file   Years of education: Not on file   Highest education level: Not on file  Occupational History   Not on file  Tobacco Use   Smoking status: Never   Smokeless tobacco: Never  Substance and Sexual Activity   Alcohol use: Yes    Comment: rarely drink once a month   Drug use: No    Comment: denies any illicit drug use   Sexual activity: Not on file  Other Topics Concern   Not on file  Social History Narrative   Not on file   Social Determinants of Health   Financial Resource Strain: Not on file  Food Insecurity: Not on file  Transportation Needs: Not on file  Physical Activity: Not on file  Stress: Not on file  Social Connections:  Not on file     Family History: The patient's family history includes CAD in her father and mother; Diabetes Mellitus II in her mother; Hyperlipidemia in her mother.  ROS:   Review of Systems  Constitution: Negative for decreased appetite, fever and weight gain.  HENT: Negative for congestion, ear discharge, hoarse voice and sore throat.   Eyes: Negative for discharge, redness, vision loss in right eye and visual halos.  Cardiovascular: Negative for chest pain, dyspnea on exertion, leg swelling, orthopnea and palpitations.  Respiratory: Negative for cough, hemoptysis, shortness of breath and snoring.   Endocrine: Negative for heat intolerance and polyphagia.  Hematologic/Lymphatic: Negative for bleeding problem. Does not bruise/bleed easily.  Skin: Negative for flushing, nail changes, rash and suspicious lesions.   Musculoskeletal: Negative for arthritis, joint pain, muscle cramps, myalgias, neck pain and stiffness.  Gastrointestinal: Negative for abdominal pain, bowel incontinence, diarrhea and excessive appetite.  Genitourinary: Negative for decreased libido, genital sores and incomplete emptying.  Neurological: Negative for brief paralysis, focal weakness, headaches and loss of balance.  Psychiatric/Behavioral: Negative for altered mental status, depression and suicidal ideas.  Allergic/Immunologic: Negative for HIV exposure and persistent infections.    EKGs/Labs/Other Studies Reviewed:    The following studies were reviewed today:   EKG:  The ekg ordered today demonstrates   04/17/2021 The left ventricular ejection fraction is normal (55-65%). Nuclear stress EF: 58%. There was no ST segment deviation noted during stress. The study is normal. This is a low risk study.    TTE 04/17/2021 IMPRESSIONS   1. Left ventricular ejection fraction, by estimation, is 60 to 65%. The  left ventricle has normal function. The left ventricle has no regional  wall motion abnormalities. There is moderate left ventricular hypertrophy.  Left ventricular diastolic  parameters were normal.   2. Right ventricular systolic function is normal. The right ventricular  size is normal. Tricuspid regurgitation signal is inadequate for assessing  PA pressure.   3. The mitral valve is normal in structure. Trivial mitral valve  regurgitation.   4. The aortic valve was not well visualized. Aortic valve regurgitation  is not visualized. Mild aortic valve sclerosis is present, with no  evidence of aortic valve stenosis.   5. The inferior vena cava is normal in size with greater than 50%  respiratory variability, suggesting right atrial pressure of 3 mmHg.   FINDINGS   Left Ventricle: Left ventricular ejection fraction, by estimation, is 60  to 65%. The left ventricle has normal function. The left ventricle has no   regional wall motion abnormalities. The left ventricular internal cavity  size was normal in size. There is   moderate left ventricular hypertrophy. Left ventricular diastolic  parameters were normal.   Right Ventricle: The right ventricular size is normal. No increase in  right ventricular wall thickness. Right ventricular systolic function is  normal. Tricuspid regurgitation signal is inadequate for assessing PA  pressure.   Left Atrium: Left atrial size was normal in size.   Right Atrium: Right atrial size was normal in size.   Pericardium: Trivial pericardial effusion is present.   Mitral Valve: The mitral valve is normal in structure. Trivial mitral  valve regurgitation.   Tricuspid Valve: The tricuspid valve is normal in structure. Tricuspid  valve regurgitation is trivial.   Aortic Valve: The aortic valve was not well visualized. Aortic valve  regurgitation is not visualized. Mild aortic valve sclerosis is present,  with no evidence of aortic valve stenosis.   Pulmonic Valve:  The pulmonic valve was not well visualized. Pulmonic valve  regurgitation is not visualized.   Aorta: The aortic root and ascending aorta are structurally normal, with  no evidence of dilitation.   Venous: The inferior vena cava is normal in size with greater than 50%  respiratory variability, suggesting right atrial pressure of 3 mmHg.   IAS/Shunts: The interatrial septum was not well visualized.       Recent Labs: No results found for requested labs within last 365 days.  Recent Lipid Panel No results found for: "CHOL", "TRIG", "HDL", "CHOLHDL", "VLDL", "LDLCALC", "LDLDIRECT"  Physical Exam:    VS:  BP (!) 160/100   Pulse 70   Ht '5\' 4"'$  (1.626 m)   Wt 123.5 kg   SpO2 94%   BMI 46.72 kg/m     Wt Readings from Last 3 Encounters:  12/03/22 123.5 kg  04/16/21 119.3 kg  03/18/21 119.3 kg     GEN: Well nourished, well developed in no acute distress HEENT: Normal NECK: No JVD; No  carotid bruits LYMPHATICS: No lymphadenopathy CARDIAC: S1S2 noted,RRR, no murmurs, rubs, gallops RESPIRATORY:  Clear to auscultation without rales, wheezing or rhonchi  ABDOMEN: Soft, non-tender, non-distended, +bowel sounds, no guarding. EXTREMITIES: No edema, No cyanosis, no clubbing MUSCULOSKELETAL:  No deformity  SKIN: Warm and dry NEUROLOGIC:  Alert and oriented x 3, non-focal PSYCHIATRIC:  Normal affect, good insight  ASSESSMENT:    1. Palpitations   2. Moderate left ventricular hypertrophy   3. Morbid obesity ([place])    PLAN:     I would like to rule out a cardiovascular etiology of this palpitation, therefore at this time I would like to placed a zio patch for  14  days.   She is hypertensive in the office today.  I am going to add amlodipine 5 mg daily to her medication regimen.  Will continue the Toprol-XL 25 mg a day, triamterene hydrochlorothiazide combination pill. I have asked the patient to take her blood pressure daily send me information via MyChart we will adjust her medication as appropriate.  The patient is in agreement with the above plan. The patient left the office in stable condition.  The patient will follow up in 12   Medication Adjustments/Labs and Tests Ordered: Current medicines are reviewed at length with the patient today.  Concerns regarding medicines are outlined above.  Orders Placed This Encounter  Procedures   LONG TERM MONITOR (3-14 DAYS)   EKG 12-Lead   Meds ordered this encounter  Medications   amLODipine (NORVASC) 5 MG tablet    Sig: Take 1 tablet (5 mg total) by mouth daily.    Dispense:  90 tablet    Refill:  3    Patient Instructions  Medication Instructions:  START Amlodipine 5 mg daily   *If you need a refill on your cardiac medications before your next appointment, please call your pharmacy*   Testing/Procedures:  [place] Monitor Instructions  Your physician has requested you wear a ZIO patch monitor for 14  days.  This is a single patch monitor. [employer] supplies one patch monitor per enrollment. Additional stickers are not available. Please do not apply patch if you will be having a Nuclear Stress Test,  Echocardiogram, Cardiac CT, MRI, or Chest Xray during the period you would be wearing the  monitor. The patch cannot be worn during these tests. You cannot remove and re-apply the  ZIO XT patch monitor.  Your ZIO patch monitor will be mailed  3 day USPS to your address on file. It may take 3-5 days  to receive your monitor after you have been enrolled.  Once you have received your monitor, please review the enclosed instructions. Your monitor  has already been registered assigning a specific monitor serial # to you.  Billing and Patient Assistance Program Information  We have supplied [employer] with any of your insurance information on file for billing purposes. [employer] offers a sliding scale Patient Assistance Program for patients that do not have  insurance, or whose insurance does not completely cover the cost of the ZIO monitor.  You must apply for the Patient Assistance Program to qualify for this discounted rate.  To apply, please call [employer] at [phone], select option 4, select option 2, ask to apply for  Patient Assistance Program. Theodore Demark will ask your household income, and how many people  are in your household. They will quote your out-of-pocket cost based on that information.  [employer] will also be able to set up a 74-month interest-free payment plan if needed.  Applying the monitor   Shave hair from upper left chest.  Hold abrader disc by orange tab. Rub abrader in 40 strokes over the upper left chest as  indicated in your monitor instructions.  Clean area with 4 enclosed alcohol pads. Let dry.  Apply patch as indicated in monitor instructions. Patch will be placed under collarbone on left  side of chest with arrow pointing upward.  Rub patch adhesive wings for 2 minutes.  Remove white label marked "1". Remove the white  label marked "2". Rub patch adhesive wings for 2 additional minutes.  While looking in a mirror, press and release button in center of patch. A small green light will  flash 3-4 times. This will be your only indicator that the monitor has been turned on.  Do not shower for the first 24 hours. You may shower after the first 24 hours.  Press the button if you feel a symptom. You will hear a small click. Record Date, Time and  Symptom in the Patient Logbook.  When you are ready to remove the patch, follow instructions on the last 2 pages of Patient  Logbook. Stick patch monitor onto the last page of Patient Logbook.  Place Patient Logbook in the blue and white box. Use locking tab on box and tape box closed  securely. The blue and white box has prepaid postage on it. Please place it in the mailbox as  soon as possible. Your physician should have your test results approximately 7 days after the  monitor has been [facility]  Call [place]at [phone] you have questions regarding  your ZIO XT patch monitor. Call them immediately if you see an orange light blinking on your  monitor.  If your monitor falls off in less than 4 days, contact our Monitor department at [phone]  If your monitor becomes loose or falls off after 4 days call [employer] at [phone]  suggestions on securing your monitor    Follow-Up: [facility] needs are our [facility].  As part of our continuing mission to provide you with exceptional heart care, we have created designated Provider Care Teams.  These Care Teams include your primary Cardiologist (physician) and Advanced Practice Providers (APPs -  Physician Assistants and Nurse Practitioners) who all work together to provide you with the care you need, when you need it.  We recommend signing  up for the patient portal called "MyChart".   Sign up information is provided on this After Visit Summary.  MyChart is used to connect with patients for Virtual Visits (Telemedicine).  Patients are able to view lab/test results, encounter notes, upcoming appointments, etc.  Non-urgent messages can be sent to your provider as well.   To learn more about what you can do with MyChart, go to [email or web address].    Your next appointment:   12 week(s)  Provider:   Dr.Breydan Shillingburg   Other Instructions -Take your blood pressure for 2 weeks - let us know via MyChart those readings.   -If you have symptoms, please log what you ate for that day along with symptoms occurring.    Adopting a Healthy Lifestyle.  Know what a healthy weight is for you (roughly BMI <25) and aim to maintain this   Aim for 7+ servings of fruits and vegetables daily   65-80+ fluid ounces of water or unsweet tea for healthy kidneys   Limit to max 1 drink of alcohol per day; avoid smoking/tobacco   Limit animal fats in diet for cholesterol and heart health - choose grass fed whenever available   Avoid highly processed foods, and foods high in saturated/trans fats   Aim for low stress - take time to unwind and care for your mental health   Aim for 150 min of moderate intensity exercise weekly for heart health, and weights twice weekly for bone health   Aim for 7-9 hours of sleep daily   When it comes to diets, agreement about the perfect plan isnt easy to find, even among the experts. Experts at the [employer] developed an idea known as the Healthy Eating Plate. Just imagine a plate divided into logical, healthy portions.   The emphasis is on diet quality:   Load up on vegetables and fruits - one-half of your plate: Aim for color and variety, and remember that potatoes dont count.   Go for whole grains - one-quarter of your plate: Whole wheat, barley, wheat berries, quinoa, oats, brown rice, and foods made with them. If you want  pasta, go with whole wheat pasta.   Protein power - one-quarter of your plate: Fish, chicken, beans, and nuts are all healthy, versatile protein sources. Limit red meat.   The diet, however, does go beyond the plate, offering a few other suggestions.   Use healthy plant oils, such as olive, canola, soy, corn, sunflower and peanut. Check the labels, and avoid partially hydrogenated oil, which have unhealthy trans fats.   If youre thirsty, drink water. Coffee and tea are good in moderation, but skip sugary drinks and limit milk and dairy products to one or two daily servings.   The type of carbohydrate in the diet is more important than the amount. Some sources of carbohydrates, such as vegetables, fruits, whole grains, and beans-are healthier than others.   Finally, stay active  Signed, Berniece Salines, DO  12/03/2022 2:22 PM    [facility]

## 2022-12-03 NOTE — Progress Notes (Unsigned)
Enrolled for [employer] to mail a ZIO XT long term holter monitor to the patients address on file.  

## 2022-12-09 DIAGNOSIS — R002 Palpitations: Secondary | ICD-10-CM | POA: Diagnosis not present

## 2023-01-04 ENCOUNTER — Encounter: Payer: Self-pay | Admitting: Cardiology

## 2023-01-04 ENCOUNTER — Ambulatory Visit: Payer: Medicare Other | Attending: Cardiology | Admitting: Cardiology

## 2023-01-04 ENCOUNTER — Telehealth: Payer: Self-pay

## 2023-01-04 VITALS — BP 166/92 | HR 71 | Ht 64.0 in | Wt 267.2 lb

## 2023-01-04 DIAGNOSIS — I10 Essential (primary) hypertension: Secondary | ICD-10-CM

## 2023-01-04 DIAGNOSIS — I480 Paroxysmal atrial fibrillation: Secondary | ICD-10-CM | POA: Diagnosis not present

## 2023-01-04 DIAGNOSIS — Z79899 Other long term (current) drug therapy: Secondary | ICD-10-CM | POA: Diagnosis not present

## 2023-01-04 MED ORDER — METOPROLOL SUCCINATE ER 25 MG PO TB24: 25.0000 mg | ORAL_TABLET | Freq: Every day | ORAL | 3 refills | Status: DC

## 2023-01-04 MED ORDER — AMLODIPINE BESYLATE 10 MG PO TABS: 10.0000 mg | ORAL_TABLET | Freq: Every day | ORAL | 3 refills | Status: DC

## 2023-01-04 MED ORDER — RIVAROXABAN 20 MG PO TABS: 20.0000 mg | ORAL_TABLET | Freq: Every day | ORAL | 3 refills | Status: DC

## 2023-01-04 MED ORDER — METOPROLOL SUCCINATE ER 50 MG PO TB24: 50.0000 mg | ORAL_TABLET | Freq: Every day | ORAL | 3 refills | Status: DC

## 2023-01-04 MED ORDER — FLECAINIDE ACETATE 50 MG PO TABS: 50.0000 mg | ORAL_TABLET | Freq: Two times a day (BID) | ORAL | 3 refills | Status: DC

## 2023-01-04 NOTE — Progress Notes (Signed)
Cardiology Office Note:    Date:  01/04/2023   ID:  Donna-LEE Larsen, DOB July 06, 1956, MRN [id number]  PCP:  Kelton Pillar, MD  Cardiologist:  Berniece Salines, DO  Electrophysiologist:  None   Referring MD: Kelton Pillar, MD   " I am having palpitation"  History of Present Illness:    Donna Larsen is a 67 y.o. female with a hx of paroxysmal atrial fibrillation seen on recent ZIO monitor, hypertension, stroke depression, hypothyroidism.  Previously followed with Dr. Candee Furbish will last seen in 2022.  I saw the patient on December 02, 2022 at that time she was experiencing significant palpitations.  She was also hypertensive I added amlodipine 5 mg daily to her regimen.  I continue her Toprol-XL as well as her hydrochlorothiazide triamterene.  Placed a monitor on the patient.  She did wear heart monitor which came back with evidence of 20% burden of atrial fibrillation.   Past Medical History:  Diagnosis Date   Allergies    Anxiety    Depression    GERD (gastroesophageal reflux disease)    Gout    Hypertension    Hypothyroidism    Irregular heart beat    Obesity    Prediabetes    Shingles    Stroke ([place])    Thyroid disease     Past Surgical History:  Procedure Laterality Date   CESAREAN SECTION      Current Medications: Current Meds  Medication Sig   allopurinol (ZYLOPRIM) 100 MG tablet Take 100 mg by mouth daily.   amLODipine (NORVASC) 10 MG tablet Take 1 tablet (10 mg total) by mouth daily.   atorvastatin (LIPITOR) 20 MG tablet Take 1 tablet by mouth daily.   flecainide (TAMBOCOR) 50 MG tablet Take 1 tablet (50 mg total) by mouth 2 (two) times daily.   levonorgestrel (KYLEENA) 19.5 MG IUD Take 1 device by intrauterine route.   levothyroxine (SYNTHROID) 125 MCG tablet Take 125 mcg by mouth daily.   metoprolol succinate (TOPROL XL) 25 MG 24 hr tablet Take 1 tablet (25 mg total) by mouth daily.   Multiple Vitamins-Minerals (CENTRUM SILVER ADULT 50+) TABS 1  tablet Orally once a day   potassium chloride (K-DUR) 10 MEQ tablet Take 10 mEq by mouth every other day.    rivaroxaban (XARELTO) 20 MG TABS tablet Take 1 tablet (20 mg total) by mouth daily with supper.   triamterene-hydrochlorothiazide (MAXZIDE) 75-50 MG per tablet Take 1 tablet by mouth daily.   [DISCONTINUED] amLODipine (NORVASC) 5 MG tablet Take 1 tablet (5 mg total) by mouth daily.   [DISCONTINUED] aspirin 81 MG EC tablet Take 1 tablet (81 mg total) by mouth daily. Swallow whole. (Patient taking differently: Take 81 mg by mouth every other day. Swallow whole.)   [DISCONTINUED] metoprolol succinate (TOPROL XL) 50 MG 24 hr tablet Take 1 tablet (50 mg total) by mouth daily. Take with or immediately following a meal.   [DISCONTINUED] metoprolol succinate (TOPROL-XL) 25 MG 24 hr tablet Take 25 mg by mouth daily.     Allergies:   Dust mite extract and Other   Social History   Socioeconomic History   Marital status: Married    Spouse name: Not on file   Number of children: Not on file   Years of education: Not on file   Highest education level: Not on file  Occupational History   Not on file  Tobacco Use   Smoking status: Never   Smokeless tobacco: Never  Substance  and Sexual Activity   Alcohol use: Yes    Comment: rarely drink once a month   Drug use: No    Comment: denies any illicit drug use   Sexual activity: Not on file  Other Topics Concern   Not on file  Social History Narrative   Not on file   Social Determinants of Health   Financial Resource Strain: Not on file  Food Insecurity: Not on file  Transportation Needs: Not on file  Physical Activity: Not on file  Stress: Not on file  Social Connections: Not on file     Family History: The patient's family history includes CAD in her father and mother; Diabetes Mellitus II in her mother; Hyperlipidemia in her mother.  ROS:   Review of Systems  Constitution: Negative for decreased appetite, fever and weight gain.   HENT: Negative for congestion, ear discharge, hoarse voice and sore throat.   Eyes: Negative for discharge, redness, vision loss in right eye and visual halos.  Cardiovascular: Negative for chest pain, dyspnea on exertion, leg swelling, orthopnea and palpitations.  Respiratory: Negative for cough, hemoptysis, shortness of breath and snoring.   Endocrine: Negative for heat intolerance and polyphagia.  Hematologic/Lymphatic: Negative for bleeding problem. Does not bruise/bleed easily.  Skin: Negative for flushing, nail changes, rash and suspicious lesions.  Musculoskeletal: Negative for arthritis, joint pain, muscle cramps, myalgias, neck pain and stiffness.  Gastrointestinal: Negative for abdominal pain, bowel incontinence, diarrhea and excessive appetite.  Genitourinary: Negative for decreased libido, genital sores and incomplete emptying.  Neurological: Negative for brief paralysis, focal weakness, headaches and loss of balance.  Psychiatric/Behavioral: Negative for altered mental status, depression and suicidal ideas.  Allergic/Immunologic: Negative for HIV exposure and persistent infections.    EKGs/Labs/Other Studies Reviewed:    The following studies were reviewed today:   EKG:  The ekg ordered today demonstrates sinus rhythm, heart rate 67 beats minute.  ZIO monitor February 2024   Patch Wear Time:  12 days and 3 hours (2024-01-31T04:18:54-0500 to 2024-02-12T07:49:44-0500)   Patient had a min HR of 54 bpm, max HR of 200 bpm, and avg HR of 86 bpm. Predominant underlying rhythm was Sinus Rhythm. Atrial Fibrillation occurred (20% burden), ranging from 93-200 bpm (avg of 143 bpm), the longest lasting 6 hours 40 mins with an avg rate of 149 bpm. Atrial Fibrillation was detected within +/- 45 seconds of symptomatic patient event(s). Isolated SVEs were rare (<1.0%), SVE Couplets were rare (<1.0%), and SVE Triplets were rare (<1.0%). Isolated VEs were rare (<1.0%), and no VE  Couplets or  VE Triplets were present.    Symptoms associated with atrial fibrillation.   Conclusion: This study is remarkable for symptomatic atrial fibrillation with rapid ventricular rate totem 20% burden.    04/17/2021 The left ventricular ejection fraction is normal (55-65%). Nuclear stress EF: 58%. There was no ST segment deviation noted during stress. The study is normal. This is a low risk study.    TTE 04/17/2021 IMPRESSIONS   1. Left ventricular ejection fraction, by estimation, is 60 to 65%. The  left ventricle has normal function. The left ventricle has no regional  wall motion abnormalities. There is moderate left ventricular hypertrophy.  Left ventricular diastolic  parameters were normal.   2. Right ventricular systolic function is normal. The right ventricular  size is normal. Tricuspid regurgitation signal is inadequate for assessing  PA pressure.   3. The mitral valve is normal in structure. Trivial mitral valve  regurgitation.   4. The  aortic valve was not well visualized. Aortic valve regurgitation  is not visualized. Mild aortic valve sclerosis is present, with no  evidence of aortic valve stenosis.   5. The inferior vena cava is normal in size with greater than 50%  respiratory variability, suggesting right atrial pressure of 3 mmHg.   FINDINGS   Left Ventricle: Left ventricular ejection fraction, by estimation, is 60  to 65%. The left ventricle has normal function. The left ventricle has no  regional wall motion abnormalities. The left ventricular internal cavity  size was normal in size. There is   moderate left ventricular hypertrophy. Left ventricular diastolic  parameters were normal.   Right Ventricle: The right ventricular size is normal. No increase in  right ventricular wall thickness. Right ventricular systolic function is  normal. Tricuspid regurgitation signal is inadequate for assessing PA  pressure.   Left Atrium: Left atrial size was normal in size.    Right Atrium: Right atrial size was normal in size.   Pericardium: Trivial pericardial effusion is present.   Mitral Valve: The mitral valve is normal in structure. Trivial mitral  valve regurgitation.   Tricuspid Valve: The tricuspid valve is normal in structure. Tricuspid  valve regurgitation is trivial.   Aortic Valve: The aortic valve was not well visualized. Aortic valve  regurgitation is not visualized. Mild aortic valve sclerosis is present,  with no evidence of aortic valve stenosis.   Pulmonic Valve: The pulmonic valve was not well visualized. Pulmonic valve  regurgitation is not visualized.   Aorta: The aortic root and ascending aorta are structurally normal, with  no evidence of dilitation.   Venous: The inferior vena cava is normal in size with greater than 50%  respiratory variability, suggesting right atrial pressure of 3 mmHg.   IAS/Shunts: The interatrial septum was not well visualized.       Recent Labs: No results found for requested labs within last 365 days.  Recent Lipid Panel No results found for: "CHOL", "TRIG", "HDL", "CHOLHDL", "VLDL", "LDLCALC", "LDLDIRECT"  Physical Exam:    VS:  BP (!) 166/92   Pulse 71   Ht '5\' 4"'$  (1.626 m)   Wt 121.2 kg   SpO2 91%   BMI 45.86 kg/m     Wt Readings from Last 3 Encounters:  01/04/23 121.2 kg  12/03/22 123.5 kg  04/16/21 119.3 kg     GEN: Well nourished, well developed in no acute distress HEENT: Normal NECK: No JVD; No carotid bruits LYMPHATICS: No lymphadenopathy CARDIAC: S1S2 noted,RRR, no murmurs, rubs, gallops RESPIRATORY:  Clear to auscultation without rales, wheezing or rhonchi  ABDOMEN: Soft, non-tender, non-distended, +bowel sounds, no guarding. EXTREMITIES: No edema, No cyanosis, no clubbing MUSCULOSKELETAL:  No deformity  SKIN: Warm and dry NEUROLOGIC:  Alert and oriented x 3, non-focal PSYCHIATRIC:  Normal affect, good insight  ASSESSMENT:    1. Medication management   2. PAF  (paroxysmal atrial fibrillation) ([place])   3. Primary hypertension   4. Morbid obesity ([place])     PLAN:    Home monitor show evidence of 20% burden of atrial fibrillation.  She is symptomatic with her A-fib RVR.  She is in sinus rhythm today.  I would like to start the patient on flecainide 50 mg twice daily previous stress test in 2022 was normal.  She will continue her Toprol XL 25 mg daily.  CHA2DS2-VASc score is 3 we will start anticoagulation with Xarelto. Xarelto 20 mg daily.  I have educated patient on the side  effects of this medication. I also urge the patient to abstain from any taking behaviors.  The patient understands that she is now at a high risk of bleeding due to being on anticoagulation.  She was also advised that if she ever falls and especially hit her head to be seen in the emergency department.  She is hypertensive in the office today.  At her last visit I added amlodipine '5mg'$ , I will increase that to 10 mg daily.  Will continue to monitor the patient.  The patient understands the need to lose weight with diet and exercise. We have discussed specific strategies for this.  Will get blood work today for [employer], mag and CBC.  The patient is in agreement with the above plan. The patient left the office in stable condition.  The patient will follow up in 3 months   Medication Adjustments/Labs and Tests Ordered: Current medicines are reviewed at length with the patient today.  Concerns regarding medicines are outlined above.  Orders Placed This Encounter  Procedures   Basic Metabolic Panel (BMET)   Magnesium   CBC with Differential/Platelet   Meds ordered this encounter  Medications   flecainide (TAMBOCOR) 50 MG tablet    Sig: Take 1 tablet (50 mg total) by mouth 2 (two) times daily.    Dispense:  180 tablet    Refill:  3   DISCONTD: metoprolol succinate (TOPROL XL) 50 MG 24 hr tablet    Sig: Take 1 tablet (50 mg total) by mouth daily. Take with or immediately  following a meal.    Dispense:  90 tablet    Refill:  3   rivaroxaban (XARELTO) 20 MG TABS tablet    Sig: Take 1 tablet (20 mg total) by mouth daily with supper.    Dispense:  90 tablet    Refill:  3   amLODipine (NORVASC) 10 MG tablet    Sig: Take 1 tablet (10 mg total) by mouth daily.    Dispense:  90 tablet    Refill:  3   metoprolol succinate (TOPROL XL) 25 MG 24 hr tablet    Sig: Take 1 tablet (25 mg total) by mouth daily.    Dispense:  90 tablet    Refill:  3    Patient Instructions  Medication Instructions:  Your physician has recommended you make the following change in your medication:  START: Flecainide 50 mg once daily START: Xarelto 20 mg once daily INCREASE: Amlodipine 10 mg once daily *If you need a refill on your cardiac medications before your next appointment, please call your pharmacy*   Lab Work: Your physician recommends that you have labs drawn today: BMET, Mag, CBC If you have labs (blood work) drawn today and your tests are completely normal, you will receive your results only by: [place] (if you have MyChart) OR A paper copy in the mail If you have any lab test that is abnormal or we need to change your treatment, we will call you to review the results.   Testing/Procedures: None   Follow-Up: At [facility], you and your health needs are our priority.  As part of our continuing mission to provide you with exceptional heart care, we have created designated Provider Care Teams.  These Care Teams include your primary Cardiologist (physician) and Advanced Practice Providers (APPs -  Physician Assistants and Nurse Practitioners) who all work together to provide you with the care you need, when you need it.  Your next appointment:  3 month(s) (overbook is okay)   Provider:   Berniece Salines, DO     Adopting a Healthy Lifestyle.  Know what a healthy weight is for you (roughly BMI <25) and aim to maintain this   Aim for 7+ servings of  fruits and vegetables daily   65-80+ fluid ounces of water or unsweet tea for healthy kidneys   Limit to max 1 drink of alcohol per day; avoid smoking/tobacco   Limit animal fats in diet for cholesterol and heart health - choose grass fed whenever available   Avoid highly processed foods, and foods high in saturated/trans fats   Aim for low stress - take time to unwind and care for your mental health   Aim for 150 min of moderate intensity exercise weekly for heart health, and weights twice weekly for bone health   Aim for 7-9 hours of sleep daily   When it comes to diets, agreement about the perfect plan isnt easy to find, even among the experts. Experts at the [employer] developed an idea known as the Healthy Eating Plate. Just imagine a plate divided into logical, healthy portions.   The emphasis is on diet quality:   Load up on vegetables and fruits - one-half of your plate: Aim for color and variety, and remember that potatoes dont count.   Go for whole grains - one-quarter of your plate: Whole wheat, barley, wheat berries, quinoa, oats, brown rice, and foods made with them. If you want pasta, go with whole wheat pasta.   Protein power - one-quarter of your plate: Fish, chicken, beans, and nuts are all healthy, versatile protein sources. Limit red meat.   The diet, however, does go beyond the plate, offering a few other suggestions.   Use healthy plant oils, such as olive, canola, soy, corn, sunflower and peanut. Check the labels, and avoid partially hydrogenated oil, which have unhealthy trans fats.   If youre thirsty, drink water. Coffee and tea are good in moderation, but skip sugary drinks and limit milk and dairy products to one or two daily servings.   The type of carbohydrate in the diet is more important than the amount. Some sources of carbohydrates, such as vegetables, fruits, whole grains, and beans-are healthier than others.   Finally, stay  active  Signed, Berniece Salines, DO  01/04/2023 2:04 PM    [facility]

## 2023-01-04 NOTE — Telephone Encounter (Signed)
Patient returning call. Did not see any appointment slots open for tomorrow or Wednesday with Dr. Harriet Masson.

## 2023-01-04 NOTE — Telephone Encounter (Signed)
Pt added schedule today.

## 2023-01-04 NOTE — Telephone Encounter (Signed)
Called pt, left message for her to call back to set up an appt tomorrow or Wednesday.

## 2023-01-04 NOTE — Patient Instructions (Addendum)
Medication Instructions:  Your physician has recommended you make the following change in your medication:  START: Flecainide 50 mg once daily START: Xarelto 20 mg once daily INCREASE: Amlodipine 10 mg once daily *If you need a refill on your cardiac medications before your next appointment, please call your pharmacy*   Lab Work: Your physician recommends that you have labs drawn today: BMET, Mag, CBC If you have labs (blood work) drawn today and your tests are completely normal, you will receive your results only by: [place] (if you have MyChart) OR A paper copy in the mail If you have any lab test that is abnormal or we need to change your treatment, we will call you to review the results.   Testing/Procedures: None   Follow-Up: At [facility], you and your health needs are our priority.  As part of our continuing mission to provide you with exceptional heart care, we have created designated Provider Care Teams.  These Care Teams include your primary Cardiologist (physician) and Advanced Practice Providers (APPs -  Physician Assistants and Nurse Practitioners) who all work together to provide you with the care you need, when you need it.  Your next appointment:   3 month(s) (overbook is okay)   Provider:   Berniece Salines, DO

## 2023-01-05 LAB — CBC WITH DIFFERENTIAL/PLATELET
Basophils Absolute: 0 10*3/uL (ref 0.0–0.2)
Basos: 0 %
EOS (ABSOLUTE): 0.1 10*3/uL (ref 0.0–0.4)
Eos: 1 %
Hematocrit: 40.3 % (ref 34.0–46.6)
Hemoglobin: 13.1 g/dL (ref 11.1–15.9)
Immature Grans (Abs): 0 10*3/uL (ref 0.0–0.1)
Immature Granulocytes: 0 %
Lymphocytes Absolute: 2.3 10*3/uL (ref 0.7–3.1)
Lymphs: 31 %
MCH: 28.7 pg (ref 26.6–33.0)
MCHC: 32.5 g/dL (ref 31.5–35.7)
MCV: 88 fL (ref 79–97)
Monocytes Absolute: 0.9 10*3/uL (ref 0.1–0.9)
Monocytes: 12 %
Neutrophils Absolute: 4.1 10*3/uL (ref 1.4–7.0)
Neutrophils: 56 %
Platelets: 171 10*3/uL (ref 150–450)
RBC: 4.57 x10E6/uL (ref 3.77–5.28)
RDW: 12.5 % (ref 11.7–15.4)
WBC: 7.3 10*3/uL (ref 3.4–10.8)

## 2023-01-05 LAB — BASIC METABOLIC PANEL
BUN/Creatinine Ratio: 16 (ref 12–28)
BUN: 11 mg/dL (ref 8–27)
CO2: 25 mmol/L (ref 20–29)
Calcium: 10.2 mg/dL (ref 8.7–10.3)
Chloride: 103 mmol/L (ref 96–106)
Creatinine, Ser: 0.69 mg/dL (ref 0.57–1.00)
Glucose: 129 mg/dL — ABNORMAL HIGH (ref 70–99)
Potassium: 4.1 mmol/L (ref 3.5–5.2)
Sodium: 144 mmol/L (ref 134–144)
eGFR: 96 mL/min/{1.73_m2} (ref >59)

## 2023-01-05 LAB — MAGNESIUM: Magnesium: 2.1 mg/dL (ref 1.6–2.3)

## 2023-01-06 ENCOUNTER — Telehealth: Payer: Self-pay | Admitting: *Deleted

## 2023-01-06 ENCOUNTER — Telehealth: Payer: Self-pay | Admitting: Cardiology

## 2023-01-06 MED ORDER — FLECAINIDE ACETATE 50 MG PO TABS: 50.0000 mg | ORAL_TABLET | Freq: Two times a day (BID) | ORAL | 3 refills | Status: DC

## 2023-01-06 NOTE — Telephone Encounter (Signed)
*  STAT* If patient is at the pharmacy, call can be transferred to refill team.   1. Which medications need to be refilled? (please list name of each medication and dose if known)   flecainide (TAMBOCOR) 50 MG tablet    2. Which pharmacy/location (including street and city if local pharmacy) is medication to be sent to?  [facility] # [place], [place]    3. Do they need a 30 day or 90 day supply? 90 day    Pt states that she no longer wants to do the Pt. Assistance program. Please advise

## 2023-01-06 NOTE — Telephone Encounter (Signed)
   Pre-operative Risk Assessment    Patient Name: Donna Larsen  DOB: 05-23-1956 MRN: [id number]      Request for Surgical Clearance    Procedure:   Cataract Surgery  Date of Surgery:  Clearance TBD                                 Surgeon:  Dr. Katy Fitch Surgeon's Group or Practice Name:  [facility], [place]. Phone number:  [phone] Fax number:  [phone]   Type of Clearance Requested:   - Medical  - Pharmacy:  Hold Rivaroxaban (Xarelto) No Indicated/Pt just started 2 days ago.   Type of Anesthesia:  MAC   Additional requests/questions:    Signed, Greer Ee   01/06/2023, 4:01 PM

## 2023-01-06 NOTE — Telephone Encounter (Signed)
Called pt she states she does not want to do the pt assistance program she will just pay for it out of pocket. She states she is having cataract surgery and as colonoscopy soon. Pt advised she would have to have both offices reach out to [place] for surgical clearance since she is on Xarelto. She verbalized understanding and will reach out to those offices.

## 2023-01-07 ENCOUNTER — Telehealth: Payer: Self-pay | Admitting: *Deleted

## 2023-01-07 NOTE — Telephone Encounter (Signed)
   Patient Name: Donna Larsen  DOB: 1956/05/24 MRN: [id number]  Primary Cardiologist: Berniece Salines, DO  Chart reviewed as part of pre-operative protocol coverage. Cataract extractions are recognized in guidelines as low risk surgeries that do not typically require specific preoperative testing or holding of blood thinner therapy. Therefore, given past medical history and time since last visit, based on ACC/AHA guidelines, Donna Larsen would be at acceptable risk for the planned procedure without further cardiovascular testing.   I will route this recommendation to the requesting party via Epic fax function and remove from pre-op pool.  Please call with questions.  Elgie Collard, PA-C 01/07/2023, 8:02 AM

## 2023-01-07 NOTE — Telephone Encounter (Signed)
   Pre-operative Risk Assessment    Patient Name: Donna Larsen  DOB: 12/26/1955 MRN: [id number]      Request for Surgical Clearance    Procedure:   Colonoscopy/endoscopy  Date of Surgery:  Clearance 01/27/23                                 Surgeon:  Dr. Randel Pigg Surgeon's Group or Practice Name:  [facility] Phone number:  [phone] Fax number:  [phone]   Type of Clearance Requested:   - Medical  - Pharmacy:  Hold Rivaroxaban (Xarelto) 3 days   Type of Anesthesia:   Propofol   Additional requests/questions:    [name]   01/07/2023, 11:43 AM

## 2023-01-07 NOTE — Telephone Encounter (Deleted)
surgery

## 2023-01-08 ENCOUNTER — Telehealth: Payer: Self-pay | Admitting: Cardiology

## 2023-01-08 NOTE — Telephone Encounter (Signed)
Patient with diagnosis of atrial fibrillation on Xarelto for anticoagulation.    Procedure: colonoscopy/endoscopy Date of procedure: 01/27/2023   CHA2DS2-VASc Score = 5   This indicates a 7.2% annual risk of stroke. The patient's score is based upon: CHF History: 0 HTN History: 1 Diabetes History: 0 Stroke History: 2 Vascular Disease History: 0 Age Score: 1 Gender Score: 1   Chart notes back to 2015 indicate history of stroke, but no information about when it occurred  CrCl 103 (based on adjusted body weight) Platelet count 171  GI is asking for 3 day hold, would recommend only 1-2 days because of stroke history.  Will route to Dr. Harriet Masson for final decision.    **This guidance is not considered finalized until pre-operative APP has relayed final recommendations.**

## 2023-01-08 NOTE — Telephone Encounter (Signed)
*  STAT* If patient is at the pharmacy, call can be transferred to refill team.   1. Which medications need to be refilled? (please list name of each medication and dose if known) Xarelto and Flecainide  2. Which pharmacy/location (including street and city if local pharmacy) is medication to be sent to? [facility] [place], [place], [place] please manually fax- their electronic sysytem is down  3. Do they need a 30 day or 90 day supply? 30 days and refills

## 2023-01-11 NOTE — Telephone Encounter (Signed)
     Primary Cardiologist: Berniece Salines, DO  Chart reviewed as part of pre-operative protocol coverage. Given past medical history and time since last visit, based on ACC/AHA guidelines, Donna Larsen would be at acceptable risk for the planned procedure without further cardiovascular testing.   Patient with diagnosis of atrial fibrillation on Xarelto for anticoagulation.     Procedure: colonoscopy/endoscopy Date of procedure: 01/27/2023     CHA2DS2-VASc Score = 5   This indicates a 7.2% annual risk of stroke. The patient's score is based upon: CHF History: 0 HTN History: 1 Diabetes History: 0 Stroke History: 2 Vascular Disease History: 0 Age Score: 1 Gender Score: 1   Chart notes back to 2015 indicate history of stroke, but no information about when it occurred   CrCl 103 (based on adjusted body weight) Platelet count 171   Her Xarelto may be held for 1-2 days prior to her procedure due to history of CVA.   I will route this recommendation to the requesting party via Epic fax function and remove from pre-op pool.  Please call with questions.  Jossie Ng. [name] NP-C     01/11/2023, 1:07 PM [address] Office [phone] Fax [phone]

## 2023-01-12 ENCOUNTER — Telehealth: Payer: Self-pay | Admitting: Cardiology

## 2023-01-12 ENCOUNTER — Other Ambulatory Visit: Payer: Self-pay

## 2023-01-12 MED ORDER — FLECAINIDE ACETATE 50 MG PO TABS: 50.0000 mg | ORAL_TABLET | Freq: Two times a day (BID) | ORAL | 3 refills | Status: DC

## 2023-01-12 MED ORDER — RIVAROXABAN 20 MG PO TABS: 20.0000 mg | ORAL_TABLET | Freq: Every day | ORAL | 1 refills | Status: DC

## 2023-01-12 NOTE — Telephone Encounter (Signed)
Prescription refill request for Xarelto received.  Indication: PAF Last office visit: 01/04/23  K Tobb DO Weight: 121.2kg Age: 67 Scr: 0.69 on 01/04/23 CrCl: 153.45  Based on above findings Xarelto '20mg'$  daily is the appropriate dose.  Refill approved.

## 2023-01-12 NOTE — Telephone Encounter (Signed)
Pt c/o medication issue:  1. Name of Medication: flecainide (TAMBOCOR) 50 MG tablet   2. How are you currently taking this medication (dosage and times per day)? Take 1 tablet (50 mg total) by mouth 2 (two) times daily.   3. Are you having a reaction (difficulty breathing--STAT)? no  4. What is your medication issue?  [facility] stated they have not received the prescription request for the Flecainide and wants to know if it can be manually faxed over at [phone]. They stated the pt is there waiting and has been there for quite some time.

## 2023-01-12 NOTE — Telephone Encounter (Signed)
*  STAT* If patient is at the pharmacy, call can be transferred to refill team.   1. Which medications need to be refilled? (please list name of each medication and dose if known) flecainide (TAMBOCOR) 50 MG tablet rivaroxaban (XARELTO) 20 MG TABS tablet   2. Which pharmacy/location (including street and city if local pharmacy) is medication to be sent to?  [facility] # [place], [place]    3. Do they need a 30 day or 90 day supply? 90 day    Pt completely out of medication and would like a call once refill requests have been sent. Please advise.

## 2023-01-12 NOTE — Telephone Encounter (Signed)
Patient is following up, currently at the pharmacy. [facility] received prescription for Xarelto but transmission for Flecainide failed. Please resend and call patient to confirm.

## 2023-01-12 NOTE — Telephone Encounter (Signed)
*  STAT* If patient is at the pharmacy, call can be transferred to refill team.   1. Which medications need to be refilled? (please list name of each medication and dose if known) flecainide (TAMBOCOR) 50 MG tablet   2. Which pharmacy/location (including street and city if local pharmacy) is medication to be sent to? [facility] # [place], [place]   3. Do they need a 30 day or 90 day supply? [place]

## 2023-01-12 NOTE — Telephone Encounter (Signed)
Verbal order for refill called to [facility]

## 2023-02-22 ENCOUNTER — Ambulatory Visit: Payer: Medicare Other | Admitting: Cardiology

## 2023-03-27 ENCOUNTER — Other Ambulatory Visit: Payer: Self-pay

## 2023-03-27 ENCOUNTER — Emergency Department (HOSPITAL_BASED_OUTPATIENT_CLINIC_OR_DEPARTMENT_OTHER): Payer: Medicare Other

## 2023-03-27 ENCOUNTER — Emergency Department (HOSPITAL_BASED_OUTPATIENT_CLINIC_OR_DEPARTMENT_OTHER)
Admission: EM | Admit: 2023-03-27 | Discharge: 2023-03-27 | Disposition: A | Discharge status: Home / Self Care | Payer: Medicare Other | Attending: Emergency Medicine | Admitting: Emergency Medicine

## 2023-03-27 ENCOUNTER — Encounter (HOSPITAL_BASED_OUTPATIENT_CLINIC_OR_DEPARTMENT_OTHER): Payer: Self-pay | Admitting: Emergency Medicine

## 2023-03-27 DIAGNOSIS — Z79890 Hormone replacement therapy (postmenopausal): Secondary | ICD-10-CM | POA: Insufficient documentation

## 2023-03-27 DIAGNOSIS — Z7901 Long term (current) use of anticoagulants: Secondary | ICD-10-CM | POA: Diagnosis not present

## 2023-03-27 DIAGNOSIS — E039 Hypothyroidism, unspecified: Secondary | ICD-10-CM | POA: Diagnosis not present

## 2023-03-27 DIAGNOSIS — I10 Essential (primary) hypertension: Secondary | ICD-10-CM | POA: Diagnosis not present

## 2023-03-27 DIAGNOSIS — Z79899 Other long term (current) drug therapy: Secondary | ICD-10-CM | POA: Insufficient documentation

## 2023-03-27 DIAGNOSIS — M25532 Pain in left wrist: Secondary | ICD-10-CM

## 2023-03-27 HISTORY — DX: Unspecified atrial fibrillation: I48.91

## 2023-03-27 MED ORDER — TRAMADOL HCL 50 MG PO TABS: 50.0000 mg | ORAL_TABLET | Freq: Four times a day (QID) | ORAL | 0 refills | Status: DC | PRN

## 2023-03-27 NOTE — Discharge Instructions (Addendum)
Your history, exam, evaluation today are consistent with musculoskeletal pain in the thumb and wrist.  The exam did not seem completely consistent with a new gout presentation nor does seem consistent with a septic arthritis or infection in the joint.  I suspect this is musculoskeletal pain so please use the wrist brace and pain medicine to help with your symptoms and follow-up with your orthopedist.  If any symptoms change or worsen acutely, please return to the nearest emergency department.

## 2023-03-27 NOTE — ED Notes (Signed)
dispo?

## 2023-03-27 NOTE — ED Provider Notes (Signed)
[place] EMERGENCY DEPARTMENT AT [facility] Provider Note   CSN: [id number] Arrival date & time: 03/27/23  1051     History  Chief Complaint  Patient presents with   Hand Pain    Donna Larsen is a 67 y.o. female.  The history is provided by the patient and medical records. No language interpreter was used.  Hand Pain This is a new problem. The current episode started yesterday. The problem occurs constantly. The problem has not changed since onset.Pertinent negatives include no chest pain, no abdominal pain, no headaches and no shortness of breath. Nothing aggravates the symptoms. Nothing relieves the symptoms. She has tried nothing for the symptoms. The treatment provided no relief.       Home Medications Prior to Admission medications   Medication Sig Start Date End Date Taking? Authorizing Provider  allopurinol (ZYLOPRIM) 100 MG tablet Take 100 mg by mouth daily.    [provider]  amLODipine (NORVASC) 10 MG tablet Take 1 tablet (10 mg total) by mouth daily. 01/04/23   Tobb, Kardie, DO  atorvastatin (LIPITOR) 20 MG tablet Take 1 tablet by mouth daily. 03/03/21   [provider]  flecainide (TAMBOCOR) 50 MG tablet Take 1 tablet (50 mg total) by mouth 2 (two) times daily. 01/12/23   Tobb, Lavona Mound, DO  levonorgestrel (KYLEENA) 19.5 MG IUD Take 1 device by intrauterine route. 11/11/21   [provider]  levothyroxine (SYNTHROID) 125 MCG tablet Take 125 mcg by mouth daily. 03/03/21   [provider]  metoprolol succinate (TOPROL XL) 25 MG 24 hr tablet Take 1 tablet (25 mg total) by mouth daily. 01/04/23   Tobb, Lavona Mound, DO  Multiple Vitamins-Minerals (CENTRUM SILVER ADULT 50+) TABS 1 tablet Orally once a day 12/11/15   [provider]  potassium chloride (K-DUR) 10 MEQ tablet Take 10 mEq by mouth every other day.     [provider]  rivaroxaban (XARELTO) 20 MG TABS tablet Take 1 tablet (20 mg total) by mouth daily with  supper. 01/12/23   Tobb, Kardie, DO  triamterene-hydrochlorothiazide (MAXZIDE) 75-50 MG per tablet Take 1 tablet by mouth daily.    [provider]      Allergies    Dust mite extract and Other    Review of Systems   Review of Systems  Constitutional:  Negative for chills, fatigue and fever.  HENT:  Negative for congestion.   Eyes:  Negative for visual disturbance.  Respiratory:  Negative for cough, chest tightness and shortness of breath.   Cardiovascular:  Negative for chest pain.  Gastrointestinal:  Negative for abdominal pain, constipation, diarrhea, nausea and vomiting.  Genitourinary:  Negative for dysuria.  Musculoskeletal:  Negative for back pain, neck pain and neck stiffness.  Skin:  Negative for color change, rash and wound.  Neurological:  Negative for weakness, light-headedness, numbness and headaches.  Psychiatric/Behavioral:  Negative for agitation.   All other systems reviewed and are negative.   Physical Exam Updated Vital Signs BP (!) 184/95   Pulse 69   Temp 98.4 F (36.9 C) (Oral)   Resp 20   SpO2 100%  Physical Exam Vitals and nursing note reviewed.  Constitutional:      General: She is not in acute distress.    Appearance: She is well-developed. She is not ill-appearing, toxic-appearing or diaphoretic.  HENT:     Head: Normocephalic and atraumatic.     Mouth/Throat:     Mouth: Mucous membranes are moist.  Eyes:  Conjunctiva/sclera: Conjunctivae normal.  Neck:     Comments: Spurling negative Cardiovascular:     Rate and Rhythm: Normal rate and regular rhythm.     Heart sounds: No murmur heard. Pulmonary:     Effort: Pulmonary effort is normal. No respiratory distress.     Breath sounds: Normal breath sounds. No wheezing, rhonchi or rales.  Chest:     Chest wall: No tenderness.  Abdominal:     General: Abdomen is flat.     Palpations: Abdomen is soft.     Tenderness: There is no abdominal tenderness.  Musculoskeletal:         General: Tenderness present. No swelling or signs of injury.     Cervical back: Neck supple. No tenderness.     Right lower leg: No edema.     Left lower leg: No edema.  Skin:    General: Skin is warm and dry.     Capillary Refill: Capillary refill takes less than 2 seconds.     Findings: No erythema or rash.  Neurological:     General: No focal deficit present.     Mental Status: She is alert.     Sensory: No sensory deficit.     Motor: No weakness.  Psychiatric:        Mood and Affect: Mood normal.     ED Results / Procedures / Treatments   Labs (all labs ordered are listed, but only abnormal results are displayed) Labs Reviewed - No data to display  EKG None  Radiology DG Wrist Complete Left  Result Date: 03/27/2023 CLINICAL DATA:  Left hand and wrist pain EXAM: LEFT WRIST - COMPLETE 3+ VIEW; LEFT HAND - COMPLETE 3+ VIEW COMPARISON:  None Available. FINDINGS: There is no evidence of fracture or dislocation of the left hand or wrist. 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 Relatively mild degenerative changes within the interphalangeal joints of the fingers. Small calcification near the long finger DIP joint along its ulnar aspect, possibly a site of calcific periarthritis. Soft tissues are otherwise unremarkable. IMPRESSION: 1. No acute osseous abnormality of the left hand or wrist. 2. 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 3. Small calcification near the long finger DIP, possibly a site of calcific periarthritis. Electronically Signed   By: Duanne Guess D.O.   On: 03/27/2023 12:50   DG Hand Complete Left  Result Date: 03/27/2023 CLINICAL DATA:  Left hand and wrist pain EXAM: LEFT WRIST - COMPLETE 3+ VIEW; LEFT HAND - COMPLETE 3+ VIEW COMPARISON:  None Available. FINDINGS: There is no evidence of fracture or dislocation of the left hand or wrist. 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 Relatively  mild degenerative changes within the interphalangeal joints of the fingers. Small calcification near the long finger DIP joint along its ulnar aspect, possibly a site of calcific periarthritis. Soft tissues are otherwise unremarkable. IMPRESSION: 1. No acute osseous abnormality of the left hand or wrist. 2. 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 3. Small calcification near the long finger DIP, possibly a site of calcific periarthritis. Electronically Signed   By: Duanne Guess D.O.   On: 03/27/2023 12:50    Procedures Procedures    Medications Ordered in ED Medications - No data to display  ED Course/ Medical Decision Making/ A&P  Medical Decision Making Amount and/or Complexity of Data Reviewed Radiology: ordered.  Risk Prescription drug management.   Donna Larsen is a 67 y.o. female with a past medical history significant for hypertension, hypothyroidism, gout, allergies, GERD, and paroxysmal atrial fibrillation on Xarelto who presents with left wrist and hand pain.  According to patient, since yesterday she developed pain in her left wrist and left thumb.  She reports no trauma and she is right-handed.  She reports that the pain in her left hand does feel somewhat similar to when she had neuropathic pain in her neck years ago but after surgery it has resolved.  She reports there is no numbness or weakness.  She denies any redness or swelling and reports does not feel like gout she has had another joints in the past.  She denies any preceding symptoms denies fevers or chills.  On exam, lungs clear and chest nontender.  Abdomen nontender.  Neck nontender and I cannot reduce her discomfort with neck manipulation, shoulder and epilation, or L manipulation.  Spurling test negative.  She had intact sensation and strength but had thumb range of motion limited due to tenderness.  She did have snuffbox tenderness.  Good capillary refill.  Exam  otherwise unremarkable.  Clinically I suspect this is musculoskeletal as if she may have slept on it or injured her thumb.  Will get x-rays to look for bony abnormality.  Clinically does not seem like gout at this time nor does it seem like a nerve type pain despite feeling similar to prior.  With her lack of any neck pain or radicular symptoms, we agreed to hold on advanced imaging at this time.  If her x-rays are reassuring, anticipate placement into a thumb spica due to snuffbox tenderness and follow-up with her orthopedist team for outpatient follow-up.  Anticipate reassessment after imaging.  X-rays returned without subacute fracture.  Arthritis seen.  No large effusions and no significant soft tissue changes seen.  Suspect musculoskeletal pain versus occult injury or fracture.  Due to the amount of pain we will give a short prescription of pain medicine and she will call to see orthopedist in the next few days.  She was placed into a removable thumb spica and will follow-up.  We discussed how it could be gout but given her exam we have low suspicion for that or septic arthritis.  We feel she is safe for discharge home and she agrees.  She had otherwise no concerns and was discharged in good condition.         Final Clinical Impression(s) / ED Diagnoses Final diagnoses:  Acute pain of left wrist    Rx / DC Orders ED Discharge Orders          Ordered    traMADol (ULTRAM) 50 MG tablet  Every 6 hours PRN        03/27/23 1449           Clinical Impression: 1. Acute pain of left wrist     Disposition: Discharge  Condition: Good  I have discussed the results, Dx and Tx plan with the pt(& family if present). He/she/they expressed understanding and agree(s) with the plan. Discharge instructions discussed at great length. Strict return precautions discussed and pt &/or family have verbalized understanding of the instructions. No further questions at time of discharge.    New  Prescriptions   TRAMADOL (ULTRAM) 50 MG TABLET    Take 1 tablet (50 mg total) by mouth every 6 (six)  hours as needed.    Follow Up: Venita Lick, MD [address] [place] [address] [phone]   with ortho     Leor Whyte, Canary Brim, MD 03/27/23 1451

## 2023-03-27 NOTE — ED Triage Notes (Signed)
Pt states yesterday her left hand and wrist and forearm started hurting,throbbing, last through the night and still hurting.she had similar pain few years ago and had to have cervical surgery.

## 2023-04-06 ENCOUNTER — Ambulatory Visit: Payer: Medicare Other | Attending: Cardiology | Admitting: Cardiology

## 2023-04-06 ENCOUNTER — Encounter: Payer: Self-pay | Admitting: Cardiology

## 2023-04-06 VITALS — BP 140/76 | HR 62 | Ht 64.0 in | Wt 268.0 lb

## 2023-04-06 DIAGNOSIS — I10 Essential (primary) hypertension: Secondary | ICD-10-CM | POA: Diagnosis not present

## 2023-04-06 DIAGNOSIS — Z79899 Other long term (current) drug therapy: Secondary | ICD-10-CM | POA: Diagnosis not present

## 2023-04-06 DIAGNOSIS — Z5181 Encounter for therapeutic drug level monitoring: Secondary | ICD-10-CM

## 2023-04-06 DIAGNOSIS — I480 Paroxysmal atrial fibrillation: Secondary | ICD-10-CM

## 2023-04-06 MED ORDER — CARVEDILOL 6.25 MG PO TABS: 6.2500 mg | ORAL_TABLET | Freq: Two times a day (BID) | ORAL | 3 refills | Status: DC

## 2023-04-06 NOTE — Patient Instructions (Addendum)
Medication Instructions:  Your physician has recommended you make the following change in your medication:  STOP: Metoprolol Succinate (Toprol-XL) START: Carvedilol (Coreg) 6.25 mg twice daily *If you need a refill on your cardiac medications before your next appointment, please call your pharmacy*   Lab Work: None   Testing/Procedures: You are scheduled for an Exercise Stress Test on    Please arrive 15 minutes prior to your appointment time for registration and insurance purposes.   The test will take approximately 45 minutes to complete.   How to prepare for your Exercise Stress Test: Do bring a list of your current medications with you.  If not listed below, you may take your medications as normal. Do wear comfortable clothes (no dresses or overalls) and walking shoes, tennis shoes preferred (no heels or open toed shoes are allowed) Do Not wear cologne, perfume, aftershave or lotions (deodorant is allowed).    If these instructions are not followed, your test will have to be rescheduled.   If you have questions or concerns about your appointment, you can call the Stress Lab at [phone].   If you cannot keep your appointment, please provide 24 hours notification to the Stress Lab, to avoid a possible $50 charge to your account.    Follow-Up: At [facility], you and your health needs are our priority.  As part of our continuing mission to provide you with exceptional heart care, we have created designated Provider Care Teams.  These Care Teams include your primary Cardiologist (physician) and Advanced Practice Providers (APPs -  Physician Assistants and Nurse Practitioners) who all work together to provide you with the care you need, when you need it.   Your next appointment:   6 month(s)  Provider:   Thomasene Ripple, DO

## 2023-04-06 NOTE — Progress Notes (Signed)
Cardiology Office Note:    Date:  04/06/2023   ID:  Donna Larsen, DOB [birth year], MRN [id number]  PCP:  Maurice Small, MD (Inactive)  Cardiologist:  Thomasene Ripple, DO  Electrophysiologist:  None   Referring MD: Maurice Small, MD   " I am having palpitation"  History of Present Illness:    Donna Larsen is a 67 y.o. female with a hx of paroxysmal atrial fibrillation seen on recent ZIO monitor which was 20%., hypertension, stroke depression, hypothyroidism.  Previously followed with Dr. Donato Schultz will last seen in 2022.  I saw the patient on December 02, 2022 at that time she was experiencing significant palpitations.  She was also hypertensive I added amlodipine 5 mg daily to her regimen.  I continue her Toprol-XL as well as her hydrochlorothiazide triamterene.  I saw the patient January 04, 2023 at that time we discussed her atrial fibrillation.  I started her on flecainide 50 mg twice a day and continue her Toprol-XL.  I also started the patient on Xarelto 20 mg daily.  Was hypertensive I increase her amlodipine to 10 mg daily.  She is here today for follow-up visit. She she offers no complaints at this time.   Past Medical History:  Diagnosis Date   A-fib (HCC)    on xarelto   Allergies    GERD (gastroesophageal reflux disease)    Gout    Hypertension    Hypothyroidism    Irregular heart beat    Obesity    Prediabetes    Shingles    Thyroid disease     Past Surgical History:  Procedure Laterality Date   CESAREAN SECTION      Current Medications: Current Meds  Medication Sig   allopurinol (ZYLOPRIM) 100 MG tablet Take 100 mg by mouth daily.   amLODipine (NORVASC) 10 MG tablet Take 1 tablet (10 mg total) by mouth daily.   atorvastatin (LIPITOR) 20 MG tablet Take 1 tablet by mouth daily.   carvedilol (COREG) 6.25 MG tablet Take 1 tablet (6.25 mg total) by mouth 2 (two) times daily.   flecainide (TAMBOCOR) 50 MG tablet Take 1 tablet (50 mg total) by mouth 2  (two) times daily.   levonorgestrel (KYLEENA) 19.5 MG IUD Take 1 device by intrauterine route.   levothyroxine (SYNTHROID) 125 MCG tablet Take 125 mcg by mouth daily.   Multiple Vitamins-Minerals (CENTRUM SILVER ADULT 50+) TABS 1 tablet Orally once a day   potassium chloride (K-DUR) 10 MEQ tablet Take 10 mEq by mouth every other day.    rivaroxaban (XARELTO) 20 MG TABS tablet Take 1 tablet (20 mg total) by mouth daily with supper.   triamterene-hydrochlorothiazide (MAXZIDE) 75-50 MG per tablet Take 1 tablet by mouth daily.   [DISCONTINUED] metoprolol succinate (TOPROL XL) 25 MG 24 hr tablet Take 1 tablet (25 mg total) by mouth daily.     Allergies:   Dust mite extract and Other   Social History   Socioeconomic History   Marital status: Married    Spouse name: Not on file   Number of children: Not on file   Years of education: Not on file   Highest education level: Not on file  Occupational History   Not on file  Tobacco Use   Smoking status: Never   Smokeless tobacco: Never  Substance and Sexual Activity   Alcohol use: Yes    Comment: rarely drink once a month   Drug use: No    Comment: denies any illicit  drug use   Sexual activity: Not on file  Other Topics Concern   Not on file  Social History Narrative   Not on file   Social Determinants of Health   Financial Resource Strain: Not on file  Food Insecurity: Not on file  Transportation Needs: Not on file  Physical Activity: Not on file  Stress: Not on file  Social Connections: Not on file     Family History: The patient's family history includes CAD in her father and mother; Diabetes Mellitus II in her mother; Hyperlipidemia in her mother.  ROS:   Review of Systems  Constitution: Negative for decreased appetite, fever and weight gain.  HENT: Negative for congestion, ear discharge, hoarse voice and sore throat.   Eyes: Negative for discharge, redness, vision loss in right eye and visual halos.  Cardiovascular:  Negative for chest pain, dyspnea on exertion, leg swelling, orthopnea and palpitations.  Respiratory: Negative for cough, hemoptysis, shortness of breath and snoring.   Endocrine: Negative for heat intolerance and polyphagia.  Hematologic/Lymphatic: Negative for bleeding problem. Does not bruise/bleed easily.  Skin: Negative for flushing, nail changes, rash and suspicious lesions.  Musculoskeletal: Negative for arthritis, joint pain, muscle cramps, myalgias, neck pain and stiffness.  Gastrointestinal: Negative for abdominal pain, bowel incontinence, diarrhea and excessive appetite.  Genitourinary: Negative for decreased libido, genital sores and incomplete emptying.  Neurological: Negative for brief paralysis, focal weakness, headaches and loss of balance.  Psychiatric/Behavioral: Negative for altered mental status, depression and suicidal ideas.  Allergic/Immunologic: Negative for HIV exposure and persistent infections.    EKGs/Labs/Other Studies Reviewed:    The following studies were reviewed today:   EKG:  The ekg ordered today demonstrates sinus rhythm, heart rate 67 beats minute.  ZIO monitor February 2024   Patch Wear Time:  12 days and 3 hours (2024-01-31T04:18:54-0500 to 2024-02-12T07:49:44-0500)   Patient had a min HR of 54 bpm, max HR of 200 bpm, and avg HR of 86 bpm. Predominant underlying rhythm was Sinus Rhythm. Atrial Fibrillation occurred (20% burden), ranging from 93-200 bpm (avg of 143 bpm), the longest lasting 6 hours 40 mins with an avg rate of 149 bpm. Atrial Fibrillation was detected within +/- 45 seconds of symptomatic patient event(s). Isolated SVEs were rare (<1.0%), SVE Couplets were rare (<1.0%), and SVE Triplets were rare (<1.0%). Isolated VEs were rare (<1.0%), and no VE  Couplets or VE Triplets were present.    Symptoms associated with atrial fibrillation.   Conclusion: This study is remarkable for symptomatic atrial fibrillation with rapid ventricular rate  totem 20% burden.    04/17/2021 The left ventricular ejection fraction is normal (55-65%). Nuclear stress EF: 58%. There was no ST segment deviation noted during stress. The study is normal. This is a low risk study.    TTE 04/17/2021 IMPRESSIONS   1. Left ventricular ejection fraction, by estimation, is 60 to 65%. The  left ventricle has normal function. The left ventricle has no regional  wall motion abnormalities. There is moderate left ventricular hypertrophy.  Left ventricular diastolic  parameters were normal.   2. Right ventricular systolic function is normal. The right ventricular  size is normal. Tricuspid regurgitation signal is inadequate for assessing  PA pressure.   3. The mitral valve is normal in structure. Trivial mitral valve  regurgitation.   4. The aortic valve was not well visualized. Aortic valve regurgitation  is not visualized. Mild aortic valve sclerosis is present, with no  evidence of aortic valve stenosis.  5. The inferior vena cava is normal in size with greater than 50%  respiratory variability, suggesting right atrial pressure of 3 mmHg.   FINDINGS   Left Ventricle: Left ventricular ejection fraction, by estimation, is 60  to 65%. The left ventricle has normal function. The left ventricle has no  regional wall motion abnormalities. The left ventricular internal cavity  size was normal in size. There is   moderate left ventricular hypertrophy. Left ventricular diastolic  parameters were normal.   Right Ventricle: The right ventricular size is normal. No increase in  right ventricular wall thickness. Right ventricular systolic function is  normal. Tricuspid regurgitation signal is inadequate for assessing PA  pressure.   Left Atrium: Left atrial size was normal in size.   Right Atrium: Right atrial size was normal in size.   Pericardium: Trivial pericardial effusion is present.   Mitral Valve: The mitral valve is normal in structure. Trivial  mitral  valve regurgitation.   Tricuspid Valve: The tricuspid valve is normal in structure. Tricuspid  valve regurgitation is trivial.   Aortic Valve: The aortic valve was not well visualized. Aortic valve  regurgitation is not visualized. Mild aortic valve sclerosis is present,  with no evidence of aortic valve stenosis.   Pulmonic Valve: The pulmonic valve was not well visualized. Pulmonic valve  regurgitation is not visualized.   Aorta: The aortic root and ascending aorta are structurally normal, with  no evidence of dilitation.   Venous: The inferior vena cava is normal in size with greater than 50%  respiratory variability, suggesting right atrial pressure of 3 mmHg.   IAS/Shunts: The interatrial septum was not well visualized.       Recent Labs: 01/04/2023: BUN 11; Creatinine, Ser 0.69; Hemoglobin 13.1; Magnesium 2.1; Platelets 171; Potassium 4.1; Sodium 144  Recent Lipid Panel No results found for: "CHOL", "TRIG", "HDL", "CHOLHDL", "VLDL", "LDLCALC", "LDLDIRECT"  Physical Exam:    VS:  BP (!) 140/76 (BP Location: Left Arm, Patient Position: Sitting, Cuff Size: Large)   Pulse 62   Ht 5\' 4"  (1.626 m)   Wt 268 lb (121.6 kg)   SpO2 95%   BMI 46.00 kg/m     Wt Readings from Last 3 Encounters:  04/06/23 268 lb (121.6 kg)  01/04/23 267 lb 3.2 oz (121.2 kg)  12/03/22 272 lb 3.2 oz (123.5 kg)     GEN: Well nourished, well developed in no acute distress HEENT: Normal NECK: No JVD; No carotid bruits LYMPHATICS: No lymphadenopathy CARDIAC: S1S2 noted,RRR, no murmurs, rubs, gallops RESPIRATORY:  Clear to auscultation without rales, wheezing or rhonchi  ABDOMEN: Soft, non-tender, non-distended, +bowel sounds, no guarding. EXTREMITIES: No edema, No cyanosis, no clubbing MUSCULOSKELETAL:  No deformity  SKIN: Warm and dry NEUROLOGIC:  Alert and oriented x 3, non-focal PSYCHIATRIC:  Normal affect, good insight  ASSESSMENT:    1. Medication management   2. Encounter for  monitoring flecainide therapy   3. PAF (paroxysmal atrial fibrillation) (HCC)   4. Primary hypertension   5. Morbid obesity (HCC)    PLAN:    At her last visit I started the patient on flecainide continue her metoprolol succinate, started Xarelto due to a CHA2DS2-VASc score of 3.  EKG in office today shows sinus bradycardia, heart rate 54 bpm, hyperlipidemia exercise stress test for flecainide monitoring to rule out induced  She is hypertensive in the office today.  Will stop Toprol-XL start Coreg 6.25 mg twice daily.  The patient understands the need to lose weight  with diet and exercise. We have discussed specific strategies for this.   The patient is in agreement with the above plan. The patient left the office in stable condition.  The patient will follow up in 3 months   Medication Adjustments/Labs and Tests Ordered: Current medicines are reviewed at length with the patient today.  Concerns regarding medicines are outlined above.  Orders Placed This Encounter  Procedures   Cardiac Stress Test: Informed Consent Details: Physician/Practitioner Attestation; Transcribe to consent form and obtain patient signature   EXERCISE TOLERANCE TEST (ETT)   EKG 12-Lead   Meds ordered this encounter  Medications   carvedilol (COREG) 6.25 MG tablet    Sig: Take 1 tablet (6.25 mg total) by mouth 2 (two) times daily.    Dispense:  180 tablet    Refill:  3    Patient Instructions  Medication Instructions:  Your physician has recommended you make the following change in your medication:  STOP: Metoprolol Succinate (Toprol-XL) START: Carvedilol (Coreg) 6.25 mg twice daily *If you need a refill on your cardiac medications before your next appointment, please call your pharmacy*   Lab Work: None   Testing/Procedures: You are scheduled for an Exercise Stress Test on    Please arrive 15 minutes prior to your appointment time for registration and insurance purposes.   The test will take  approximately 45 minutes to complete.   How to prepare for your Exercise Stress Test: Do bring a list of your current medications with you.  If not listed below, you may take your medications as normal. Do wear comfortable clothes (no dresses or overalls) and walking shoes, tennis shoes preferred (no heels or open toed shoes are allowed) Do Not wear cologne, perfume, aftershave or lotions (deodorant is allowed).    If these instructions are not followed, your test will have to be rescheduled.   If you have questions or concerns about your appointment, you can call the Stress Lab at [phone].   If you cannot keep your appointment, please provide 24 hours notification to the Stress Lab, to avoid a possible $50 charge to your account.    Follow-Up: At [facility], you and your health needs are our priority.  As part of our continuing mission to provide you with exceptional heart care, we have created designated Provider Care Teams.  These Care Teams include your primary Cardiologist (physician) and Advanced Practice Providers (APPs -  Physician Assistants and Nurse Practitioners) who all work together to provide you with the care you need, when you need it.   Your next appointment:   6 month(s)  Provider:   Thomasene Ripple, DO    Adopting a Healthy Lifestyle.  Know what a healthy weight is for you (roughly BMI <25) and aim to maintain this   Aim for 7+ servings of fruits and vegetables daily   65-80+ fluid ounces of water or unsweet tea for healthy kidneys   Limit to max 1 drink of alcohol per day; avoid smoking/tobacco   Limit animal fats in diet for cholesterol and heart health - choose grass fed whenever available   Avoid highly processed foods, and foods high in saturated/trans fats   Aim for low stress - take time to unwind and care for your mental health   Aim for 150 min of moderate intensity exercise weekly for heart health, and weights twice weekly for bone  health   Aim for 7-9 hours of sleep daily   When it comes to diets,  agreement about the perfect plan isnt easy to find, even among the experts. Experts at the [facility] [employer] developed an idea known as the Healthy Eating Plate. Just imagine a plate divided into logical, healthy portions.   The emphasis is on diet quality:   Load up on vegetables and fruits - one-half of your plate: Aim for color and variety, and remember that potatoes dont count.   Go for whole grains - one-quarter of your plate: Whole wheat, barley, wheat berries, quinoa, oats, brown rice, and foods made with them. If you want pasta, go with whole wheat pasta.   Protein power - one-quarter of your plate: Fish, chicken, beans, and nuts are all healthy, versatile protein sources. Limit red meat.   The diet, however, does go beyond the plate, offering a few other suggestions.   Use healthy plant oils, such as olive, canola, soy, corn, sunflower and peanut. Check the labels, and avoid partially hydrogenated oil, which have unhealthy trans fats.   If youre thirsty, drink water. Coffee and tea are good in moderation, but skip sugary drinks and limit milk and dairy products to one or two daily servings.   The type of carbohydrate in the diet is more important than the amount. Some sources of carbohydrates, such as vegetables, fruits, whole grains, and beans-are healthier than others.   Finally, stay active  Signed, Thomasene Ripple, DO  04/06/2023 10:26 AM    [facility]

## 2023-04-21 NOTE — Telephone Encounter (Signed)
Spoke with patient and gave instructions about her scheduled ETT on 04/23/23 at 3:30. She was reminded to wear comfortable clothes and sneakers for the treadmill.

## 2023-04-23 ENCOUNTER — Ambulatory Visit (HOSPITAL_COMMUNITY): Payer: Medicare Other | Attending: Cardiology

## 2023-04-23 DIAGNOSIS — Z79899 Other long term (current) drug therapy: Secondary | ICD-10-CM

## 2023-04-23 DIAGNOSIS — Z5181 Encounter for therapeutic drug level monitoring: Secondary | ICD-10-CM | POA: Diagnosis not present

## 2023-04-23 LAB — EXERCISE TOLERANCE TEST
Angina Index: 0
Duke Treadmill Score: 3
Estimated workload: 5.1
Exercise duration (min): 3 min
Exercise duration (sec): 29 s
MPHR: 154 {beats}/min
Peak HR: 110 {beats}/min
Percent HR: 71 %
RPE: 18
Rest HR: 61 {beats}/min
ST Depression (mm): 0 mm

## 2023-08-20 ENCOUNTER — Other Ambulatory Visit: Payer: Self-pay | Admitting: Cardiology

## 2023-08-20 DIAGNOSIS — I480 Paroxysmal atrial fibrillation: Secondary | ICD-10-CM

## 2023-08-20 NOTE — Telephone Encounter (Signed)
Prescription refill request for Xarelto received.  Indication: PAF Last office visit: 04/06/23 Weight: 268 Age: 67 Scr: 0.69  01/04/23 epic CrCl:  152 mL/min

## 2023-10-12 ENCOUNTER — Encounter: Payer: Medicare Other | Attending: Physician Assistant | Admitting: Dietician

## 2023-10-12 ENCOUNTER — Encounter: Payer: Self-pay | Admitting: Dietician

## 2023-10-12 ENCOUNTER — Ambulatory Visit: Payer: Medicare Other | Admitting: Cardiology

## 2023-10-12 DIAGNOSIS — E119 Type 2 diabetes mellitus without complications: Secondary | ICD-10-CM | POA: Diagnosis present

## 2023-10-12 DIAGNOSIS — E1169 Type 2 diabetes mellitus with other specified complication: Secondary | ICD-10-CM | POA: Insufficient documentation

## 2023-10-12 NOTE — Progress Notes (Signed)
Patient was seen on 10/12/23 for the first of a series of three diabetes self-management courses at [facility].  Patient Education Plan per assessed needs and concerns is to attend three course education program for Diabetes Self Management Education.  A1C was 6.8% on 09/06/23.  The following learning objectives were met by the patient during this class: Describe diabetes, types of diabetes and pathophysiology State some common risk factors for diabetes Defines the role of glucose and insulin Describe the relationship between diabetes and cardiovascular and other risks State the members of the Healthcare Team States the rationale for glucose monitoring and when to test State their individual Target Range State the importance of logging glucose readings and how to interpret the readings Identifies A1C target Explain the correlation between A1c and eAG values State symptoms and treatment of high blood glucose and low blood glucose Explain proper technique for glucose testing and identify proper sharps disposal  Handouts given during class include: How to Thrive:  A Guide for Your Journey with Diabetes by the ADA Meal Plan Card and carbohydrate content list Dietary intake form Low Sodium Flavoring Tips Types of Fats Dining Out Label reading Snack list The diabetes portion plate Diabetes Resources A1c to eAG Conversion Chart Blood Glucose Log Diabetes Recommended Care Schedule Support Group Diabetes Success Plan Core Class Satisfaction Survey   Follow-Up Plan: Attend core 2

## 2023-10-13 ENCOUNTER — Encounter: Payer: Self-pay | Admitting: Cardiology

## 2023-10-13 ENCOUNTER — Ambulatory Visit: Payer: Medicare Other | Attending: Cardiology | Admitting: Cardiology

## 2023-10-13 VITALS — BP 119/74 | HR 58 | Ht 64.0 in | Wt 262.0 lb

## 2023-10-13 DIAGNOSIS — R5383 Other fatigue: Secondary | ICD-10-CM | POA: Diagnosis not present

## 2023-10-13 DIAGNOSIS — R002 Palpitations: Secondary | ICD-10-CM | POA: Diagnosis not present

## 2023-10-13 DIAGNOSIS — Z79899 Other long term (current) drug therapy: Secondary | ICD-10-CM | POA: Diagnosis not present

## 2023-10-13 MED ORDER — APIXABAN 5 MG PO TABS: 5.0000 mg | ORAL_TABLET | Freq: Two times a day (BID) | ORAL | 3 refills | Status: DC

## 2023-10-13 NOTE — Progress Notes (Unsigned)
Cardiology Office Note:    Date:  10/13/2023   ID:  Donna Larsen, DOB Nov 01, 1956, MRN [id number]  PCP:  Milus Height, PA  Cardiologist:  Thomasene Ripple, DO  Electrophysiologist:  None   Referring MD: No ref. provider found   " I am having palpitation"  History of Present Illness:    Donna Larsen is a 67 y.o. female with a hx of paroxysmal atrial fibrillation seen on recent ZIO monitor which was 20%., hypertension, stroke depression, hypothyroidism.  Previously followed with Dr. Donato Schultz will last seen in 2022.  She is experiencing itching which he thinks from the xeralto.    Past Medical History:  Diagnosis Date   A-fib (HCC)    on xarelto   Allergies    GERD (gastroesophageal reflux disease)    Gout    Hypertension    Hypothyroidism    Irregular heart beat    Obesity    Prediabetes    Shingles    Thyroid disease     Past Surgical History:  Procedure Laterality Date   CESAREAN SECTION      Current Medications: Current Meds  Medication Sig   allopurinol (ZYLOPRIM) 100 MG tablet Take 100 mg by mouth daily.   amLODipine (NORVASC) 10 MG tablet Take 1 tablet (10 mg total) by mouth daily.   atorvastatin (LIPITOR) 20 MG tablet Take 1 tablet by mouth daily.   carvedilol (COREG) 6.25 MG tablet Take 1 tablet (6.25 mg total) by mouth 2 (two) times daily.   flecainide (TAMBOCOR) 50 MG tablet Take 1 tablet (50 mg total) by mouth 2 (two) times daily.   levonorgestrel (KYLEENA) 19.5 MG IUD Take 1 device by intrauterine route.   levothyroxine (SYNTHROID) 125 MCG tablet Take 125 mcg by mouth daily.   Multiple Vitamins-Minerals (CENTRUM SILVER ADULT 50+) TABS 1 tablet Orally once a day   potassium chloride (K-DUR) 10 MEQ tablet Take 10 mEq by mouth every other day.    rivaroxaban (XARELTO) 20 MG TABS tablet Take 1 tablet by mouth daily with supper   triamterene-hydrochlorothiazide (MAXZIDE) 75-50 MG per tablet Take 1 tablet by mouth daily.     Allergies:   Dust mite  extract and Other   Social History   Socioeconomic History   Marital status: Married    Spouse name: Not on file   Number of children: Not on file   Years of education: Not on file   Highest education level: Not on file  Occupational History   Not on file  Tobacco Use   Smoking status: Never   Smokeless tobacco: Never  Substance and Sexual Activity   Alcohol use: Yes    Comment: rarely drink once a month   Drug use: No    Comment: denies any illicit drug use   Sexual activity: Not on file  Other Topics Concern   Not on file  Social History Narrative   Not on file   Social Determinants of Health   Financial Resource Strain: Not on file  Food Insecurity: Not on file  Transportation Needs: Not on file  Physical Activity: Not on file  Stress: Not on file  Social Connections: Not on file     Family History: The patient's family history includes CAD in her father and mother; Diabetes Mellitus II in her mother; Hyperlipidemia in her mother.  ROS:   Review of Systems  Constitution: Negative for decreased appetite, fever and weight gain.  HENT: Negative for congestion, ear discharge, hoarse voice  and sore throat.   Eyes: Negative for discharge, redness, vision loss in right eye and visual halos.  Cardiovascular: Negative for chest pain, dyspnea on exertion, leg swelling, orthopnea and palpitations.  Respiratory: Negative for cough, hemoptysis, shortness of breath and snoring.   Endocrine: Negative for heat intolerance and polyphagia.  Hematologic/Lymphatic: Negative for bleeding problem. Does not bruise/bleed easily.  Skin: Negative for flushing, nail changes, rash and suspicious lesions.  Musculoskeletal: Negative for arthritis, joint pain, muscle cramps, myalgias, neck pain and stiffness.  Gastrointestinal: Negative for abdominal pain, bowel incontinence, diarrhea and excessive appetite.  Genitourinary: Negative for decreased libido, genital sores and incomplete emptying.   Neurological: Negative for brief paralysis, focal weakness, headaches and loss of balance.  Psychiatric/Behavioral: Negative for altered mental status, depression and suicidal ideas.  Allergic/Immunologic: Negative for HIV exposure and persistent infections.    EKGs/Labs/Other Studies Reviewed:    The following studies were reviewed today:   EKG:  The ekg ordered today demonstrates sinus rhythm, heart rate 67 beats minute.  ZIO monitor February 2024   Patch Wear Time:  12 days and 3 hours (2024-01-31T04:18:54-0500 to 2024-02-12T07:49:44-0500)   Patient had a min HR of 54 bpm, max HR of 200 bpm, and avg HR of 86 bpm. Predominant underlying rhythm was Sinus Rhythm. Atrial Fibrillation occurred (20% burden), ranging from 93-200 bpm (avg of 143 bpm), the longest lasting 6 hours 40 mins with an avg rate of 149 bpm. Atrial Fibrillation was detected within +/- 45 seconds of symptomatic patient event(s). Isolated SVEs were rare (<1.0%), SVE Couplets were rare (<1.0%), and SVE Triplets were rare (<1.0%). Isolated VEs were rare (<1.0%), and no VE  Couplets or VE Triplets were present.    Symptoms associated with atrial fibrillation.   Conclusion: This study is remarkable for symptomatic atrial fibrillation with rapid ventricular rate totem 20% burden.    04/17/2021 The left ventricular ejection fraction is normal (55-65%). Nuclear stress EF: 58%. There was no ST segment deviation noted during stress. The study is normal. This is a low risk study.    TTE 04/17/2021 IMPRESSIONS   1. Left ventricular ejection fraction, by estimation, is 60 to 65%. The  left ventricle has normal function. The left ventricle has no regional  wall motion abnormalities. There is moderate left ventricular hypertrophy.  Left ventricular diastolic  parameters were normal.   2. Right ventricular systolic function is normal. The right ventricular  size is normal. Tricuspid regurgitation signal is inadequate for  assessing  PA pressure.   3. The mitral valve is normal in structure. Trivial mitral valve  regurgitation.   4. The aortic valve was not well visualized. Aortic valve regurgitation  is not visualized. Mild aortic valve sclerosis is present, with no  evidence of aortic valve stenosis.   5. The inferior vena cava is normal in size with greater than 50%  respiratory variability, suggesting right atrial pressure of 3 mmHg.   FINDINGS   Left Ventricle: Left ventricular ejection fraction, by estimation, is 60  to 65%. The left ventricle has normal function. The left ventricle has no  regional wall motion abnormalities. The left ventricular internal cavity  size was normal in size. There is   moderate left ventricular hypertrophy. Left ventricular diastolic  parameters were normal.   Right Ventricle: The right ventricular size is normal. No increase in  right ventricular wall thickness. Right ventricular systolic function is  normal. Tricuspid regurgitation signal is inadequate for assessing PA  pressure.   Left Atrium: Left  atrial size was normal in size.   Right Atrium: Right atrial size was normal in size.   Pericardium: Trivial pericardial effusion is present.   Mitral Valve: The mitral valve is normal in structure. Trivial mitral  valve regurgitation.   Tricuspid Valve: The tricuspid valve is normal in structure. Tricuspid  valve regurgitation is trivial.   Aortic Valve: The aortic valve was not well visualized. Aortic valve  regurgitation is not visualized. Mild aortic valve sclerosis is present,  with no evidence of aortic valve stenosis.   Pulmonic Valve: The pulmonic valve was not well visualized. Pulmonic valve  regurgitation is not visualized.   Aorta: The aortic root and ascending aorta are structurally normal, with  no evidence of dilitation.   Venous: The inferior vena cava is normal in size with greater than 50%  respiratory variability, suggesting right atrial  pressure of 3 mmHg.   IAS/Shunts: The interatrial septum was not well visualized.       Recent Labs: 01/04/2023: BUN 11; Creatinine, Ser 0.69; Hemoglobin 13.1; Magnesium 2.1; Platelets 171; Potassium 4.1; Sodium 144  Recent Lipid Panel No results found for: "CHOL", "TRIG", "HDL", "CHOLHDL", "VLDL", "LDLCALC", "LDLDIRECT"  Physical Exam:    VS:  BP 119/74 (BP Location: Left Arm, Patient Position: Sitting, Cuff Size: Normal)   Pulse (!) 58   Ht 5\' 4"  (1.626 m)   Wt 262 lb (118.8 kg)   SpO2 96%   BMI 44.97 kg/m     Wt Readings from Last 3 Encounters:  10/13/23 262 lb (118.8 kg)  04/06/23 268 lb (121.6 kg)  01/04/23 267 lb 3.2 oz (121.2 kg)     GEN: Well nourished, well developed in no acute distress HEENT: Normal NECK: No JVD; No carotid bruits LYMPHATICS: No lymphadenopathy CARDIAC: S1S2 noted,RRR, no murmurs, rubs, gallops RESPIRATORY:  Clear to auscultation without rales, wheezing or rhonchi  ABDOMEN: Soft, non-tender, non-distended, +bowel sounds, no guarding. EXTREMITIES: No edema, No cyanosis, no clubbing MUSCULOSKELETAL:  No deformity  SKIN: Warm and dry NEUROLOGIC:  Alert and oriented x 3, non-focal PSYCHIATRIC:  Normal affect, good insight  ASSESSMENT:    1. Palpitations    PLAN:    Paroxysmal Atrial Fibrillation Patient reports possible allergic reaction to Xarelto, including skin itching and rash. Discussed the need for anticoagulation due to AFib and the option to switch to Eliquis. -Discontinue Xarelto and add to allergy list. -Start Eliquis 5mg  twice daily. -Order complete blood count to assess for any abnormalities related to change in anticoagulation.  Diabetes Mellitus Recent diagnosis with HbA1c of 6.8. No specific concerns or issues discussed during this visit. -Continue current management.  Follow-up in 1 year, or sooner if any issues arise. Invite to [employer] Event in February.  The patient understands the need to lose weight  with diet and exercise. We have discussed specific strategies for this.   The patient is in agreement with the above plan. The patient left the office in stable condition.  The patient will follow up in 3 months   Medication Adjustments/Labs and Tests Ordered: Current medicines are reviewed at length with the patient today.  Concerns regarding medicines are outlined above.  Orders Placed This Encounter  Procedures   EKG 12-Lead   No orders of the defined types were placed in this encounter.   There are no Patient Instructions on file for this visit.   Adopting a Healthy Lifestyle.  Know what a healthy weight is for you (roughly BMI <25) and aim to maintain  this   Aim for 7+ servings of fruits and vegetables daily   65-80+ fluid ounces of water or unsweet tea for healthy kidneys   Limit to max 1 drink of alcohol per day; avoid smoking/tobacco   Limit animal fats in diet for cholesterol and heart health - choose grass fed whenever available   Avoid highly processed foods, and foods high in saturated/trans fats   Aim for low stress - take time to unwind and care for your mental health   Aim for 150 min of moderate intensity exercise weekly for heart health, and weights twice weekly for bone health   Aim for 7-9 hours of sleep daily   When it comes to diets, agreement about the perfect plan isnt easy to find, even among the experts. Experts at the [facility] [employer] developed an idea known as the Healthy Eating Plate. Just imagine a plate divided into logical, healthy portions.   The emphasis is on diet quality:   Load up on vegetables and fruits - one-half of your plate: Aim for color and variety, and remember that potatoes dont count.   Go for whole grains - one-quarter of your plate: Whole wheat, barley, wheat berries, quinoa, oats, brown rice, and foods made with them. If you want pasta, go with whole wheat pasta.   Protein power - one-quarter of your  plate: Fish, chicken, beans, and nuts are all healthy, versatile protein sources. Limit red meat.   The diet, however, does go beyond the plate, offering a few other suggestions.   Use healthy plant oils, such as olive, canola, soy, corn, sunflower and peanut. Check the labels, and avoid partially hydrogenated oil, which have unhealthy trans fats.   If youre thirsty, drink water. Coffee and tea are good in moderation, but skip sugary drinks and limit milk and dairy products to one or two daily servings.   The type of carbohydrate in the diet is more important than the amount. Some sources of carbohydrates, such as vegetables, fruits, whole grains, and beans-are healthier than others.   Finally, stay active  Signed, Thomasene Ripple, DO  10/13/2023 10:05 AM    [facility]

## 2023-10-13 NOTE — Patient Instructions (Signed)
Medication Instructions:  Your physician has recommended you make the following change in your medication:  STOP: Rivaroxaban (Xarelto) START: Eliquis 5 mg twice daily *If you need a refill on your cardiac medications before your next appointment, please call your pharmacy*   Lab Work: CBC If you have labs (blood work) drawn today and your tests are completely normal, you will receive your results only by: MyChart Message (if you have MyChart) OR A paper copy in the mail If you have any lab test that is abnormal or we need to change your treatment, we will call you to review the results.    Follow-Up: At [facility], you and your health needs are our priority.  As part of our continuing mission to provide you with exceptional heart care, we have created designated Provider Care Teams.  These Care Teams include your primary Cardiologist (physician) and Advanced Practice Providers (APPs -  Physician Assistants and Nurse Practitioners) who all work together to provide you with the care you need, when you need it.  Your next appointment:   12 month(s)  Provider:   Thomasene Ripple, DO

## 2023-10-14 LAB — CBC WITH DIFFERENTIAL/PLATELET
Basophils Absolute: 0 10*3/uL (ref 0.0–0.2)
Basos: 0 %
EOS (ABSOLUTE): 0 10*3/uL (ref 0.0–0.4)
Eos: 1 %
Hematocrit: 39.9 % (ref 34.0–46.6)
Hemoglobin: 12.6 g/dL (ref 11.1–15.9)
Immature Grans (Abs): 0 10*3/uL (ref 0.0–0.1)
Immature Granulocytes: 1 %
Lymphocytes Absolute: 1.9 10*3/uL (ref 0.7–3.1)
Lymphs: 29 %
MCH: 28.8 pg (ref 26.6–33.0)
MCHC: 31.6 g/dL (ref 31.5–35.7)
MCV: 91 fL (ref 79–97)
Monocytes Absolute: 0.7 10*3/uL (ref 0.1–0.9)
Monocytes: 11 %
Neutrophils Absolute: 3.8 10*3/uL (ref 1.4–7.0)
Neutrophils: 58 %
Platelets: 185 10*3/uL (ref 150–450)
RBC: 4.38 x10E6/uL (ref 3.77–5.28)
RDW: 12.8 % (ref 11.7–15.4)
WBC: 6.4 10*3/uL (ref 3.4–10.8)

## 2023-10-19 ENCOUNTER — Encounter: Payer: Self-pay | Admitting: Dietician

## 2023-10-19 ENCOUNTER — Encounter: Payer: Medicare Other | Admitting: Dietician

## 2023-10-19 DIAGNOSIS — E119 Type 2 diabetes mellitus without complications: Secondary | ICD-10-CM

## 2023-10-19 DIAGNOSIS — E1169 Type 2 diabetes mellitus with other specified complication: Secondary | ICD-10-CM | POA: Diagnosis not present

## 2023-10-19 NOTE — Progress Notes (Signed)
Patient was seen on 10/19/23 for the second of a series of three diabetes self-management courses at the [facility]. The following learning objectives were met by the patient during this class:  Describe the role of different macronutrients on glucose Explain how carbohydrates affect blood glucose State what foods contain the most carbohydrates Demonstrate carbohydrate counting Demonstrate how to read Nutrition Facts food label Describe effects of various fats on heart health Describe the importance of good nutrition for health and healthy eating strategies Describe techniques for managing your shopping, cooking and meal planning List strategies to follow meal plan when dining out Describe the effects of alcohol on glucose and how to use it safely  Goals:  Follow Diabetes Meal Plan as instructed  Aim to spread carbs evenly throughout the day  Aim for 3 meals per day and snacks as needed Include lean protein foods to meals/snacks  Monitor glucose levels as instructed by your doctor   Follow-Up Plan: Attend Core 3 Work towards following your personal food plan.

## 2023-10-26 ENCOUNTER — Encounter: Payer: Medicare Other | Admitting: Dietician

## 2023-10-26 ENCOUNTER — Encounter: Payer: Self-pay | Admitting: Dietician

## 2023-10-26 DIAGNOSIS — E119 Type 2 diabetes mellitus without complications: Secondary | ICD-10-CM

## 2023-10-26 DIAGNOSIS — E1169 Type 2 diabetes mellitus with other specified complication: Secondary | ICD-10-CM | POA: Diagnosis not present

## 2023-10-26 NOTE — Progress Notes (Signed)
Patient was seen on 10/26/2023 for the third of a series of three diabetes self-management courses at the [facility].   State the amount of activity recommended for healthy living Describe activities suitable for individual needs Identify ways to regularly incorporate activity into daily life Identify barriers to activity and ways to over come these barriers Identify diabetes medications being personally used and their primary action for lowering glucose and possible side effects Describe role of stress on blood glucose and develop strategies to address psychosocial issues Identify diabetes complications and ways to prevent them Explain how to manage diabetes during illness Evaluate success in meeting personal goal Establish 2-3 goals that they will plan to diligently work on  Goals:  I will count my carb choices at most meals and snacks I will be active 30 minutes or more 5 times a week I will take my diabetes medications as scheduled I will reduce unhealthy fats. I will test my glucose at least 1 times a day, 7 days a week I will look at patterns in my record book at least 10 days a month To help manage stress I will  exercise at least 5 times a week  Your patient has identified these potential barriers to change:  Motivation Finances Stress Lack of Family Support  Your patient has identified their diabetes self-care support plan as  Family Education officer, environmental Resources [facility] Support Group  American Diabetes Association Website    Plan:  Attend Support Group as desired

## 2024-01-06 ENCOUNTER — Other Ambulatory Visit: Payer: Self-pay | Admitting: Cardiology

## 2024-01-25 ENCOUNTER — Ambulatory Visit: Payer: Medicare Other | Admitting: Dietician

## 2024-02-04 ENCOUNTER — Other Ambulatory Visit: Payer: Self-pay | Admitting: Cardiology

## 2024-02-15 ENCOUNTER — Encounter: Payer: Self-pay | Admitting: Dietician

## 2024-02-15 ENCOUNTER — Encounter: Attending: Physician Assistant | Admitting: Dietician

## 2024-02-15 NOTE — Progress Notes (Signed)
 Appointment start:  1130 Appointment End:  1145  Patient attended Diabetes Core Classes 1-3 between 10/12/23 and 10/26/23 at [facility]. The purpose of the meeting today is to review information learned during those classes as well as review patient application and goals.   What are one or two positive things that you are doing right now to manage your diabetes?  Walking and water intake (4-5 bottles)  What is the hardest part about your diabetes right now, causing you the most concern, or is the most worrisome to you about your diabetes?  none  What questions do you have today?  Pt would like to understand how a CGM works (pt does not have a CGM)  Have you participated in any diabetes support group?  no  History:  GERD, HTN, thyroid disease, type 2 diabetes A1C:  12/13/23: A1c 6.2% Medications include:  reviewed Sleep:  sleep has gotten better, 8 hours Weight:  262 lb Blood Glucose:  Fasting:  115-120mg /dL  2 hours after starting a meal:  not checking post-prandial   Have you had any low blood sugar readings in the past month?  no  Social History:  N/A Exercise:  walking 2-3 times per week for at least 1 mile.   Dietary & Lifestyle History  Pt reports she feels she has been doing well since taking the diabetes core classes. Pt reports her blood sugar has been good (A1c 6.2%, not on diabetes medication). Pt reports she checks her blood glucose 3 times per week fasting and it is usually 115-120mg /dL. Pt states she has reduced her intake of sweets, and incorporated walking and now walks 3 times per week.   24 hour diet recall: Breakfast:  toast, boiled egg and fruit Snack:  none Lunch:  1/2 - 1 sandwich and fruit Snack:  none Dinner:  meat and starch and veggies Snack:  graham crackers Beverages:  coffee with sweet creamer, water    Specific focus but not limited to the following: Review of blood glucose monitoring and interpretation including the  recommended target ranges and Hgb A1c.  Review of carbohydrate counting, importance of regularly scheduled meals/snacks, and meal planning to improve quality of diet. Review of the effects of physical activity on glucose levels and long-term glucose control.  Recommended goal of 150 minutes of physical activity/week. Review of patient medications and discussed role of medication on blood glucose and possible side effects. Discussion of strategies to manage stress, psychosocial issues, and other obstacles to diabetes management. Review of short-term complications: hyper- and hypo-glycemia (causes, symptoms, and treatment options) Review of prevention, detection, and treatment of long-term complications.  Discussion of the role of prolonged elevated glucose levels on body systems.  Continuing Goals:  Goal 1: walk 4 days a week for 30 minutes.   Future Follow up:  as needed

## 2024-04-04 ENCOUNTER — Other Ambulatory Visit: Payer: Self-pay | Admitting: Cardiology

## 2024-07-09 ENCOUNTER — Other Ambulatory Visit: Payer: Self-pay | Admitting: Cardiology

## 2024-07-18 ENCOUNTER — Other Ambulatory Visit: Payer: Self-pay

## 2024-07-18 MED ORDER — CARVEDILOL 6.25 MG PO TABS: 6.2500 mg | ORAL_TABLET | Freq: Two times a day (BID) | ORAL | 0 refills | Status: DC

## 2024-08-27 ENCOUNTER — Other Ambulatory Visit: Payer: Self-pay | Admitting: Cardiology

## 2024-10-12 ENCOUNTER — Other Ambulatory Visit: Payer: Self-pay | Admitting: Cardiology

## 2024-10-13 ENCOUNTER — Ambulatory Visit: Admitting: Cardiology

## 2024-10-18 ENCOUNTER — Other Ambulatory Visit: Payer: Self-pay | Admitting: Cardiology

## 2024-10-18 MED ORDER — APIXABAN 5 MG PO TABS: 5.0000 mg | ORAL_TABLET | Freq: Two times a day (BID) | ORAL | 0 refills | Status: DC

## 2024-10-18 NOTE — Telephone Encounter (Signed)
 Pt last saw Dr Sheena 10/13/23, pt is overdue for follow-up. Pt has appt to see Dr Sheena 12/04/24.  Last labs 12/13/23 at [facility] per care everywhere Creat 0.67, age 68, weight 118.8kg, based on specified criteria pt is on appropriate dosage of Eliquis  5mg  BID for afib.  Will refill rx to get pt to upcoming appt.

## 2024-10-23 ENCOUNTER — Other Ambulatory Visit: Payer: Self-pay | Admitting: Cardiology

## 2024-11-20 ENCOUNTER — Other Ambulatory Visit: Payer: Self-pay | Admitting: Cardiology

## 2024-11-28 ENCOUNTER — Encounter: Payer: Self-pay | Admitting: Cardiology

## 2024-11-28 ENCOUNTER — Ambulatory Visit: Attending: Cardiology | Admitting: Cardiology

## 2024-11-28 VITALS — BP 138/82 | HR 59 | Ht 64.0 in | Wt 257.8 lb

## 2024-11-28 DIAGNOSIS — Z79899 Other long term (current) drug therapy: Secondary | ICD-10-CM

## 2024-11-28 DIAGNOSIS — I480 Paroxysmal atrial fibrillation: Secondary | ICD-10-CM | POA: Diagnosis not present

## 2024-11-28 DIAGNOSIS — I10 Essential (primary) hypertension: Secondary | ICD-10-CM | POA: Diagnosis not present

## 2024-11-28 NOTE — Progress Notes (Unsigned)
 " Cardiology Office Note:    Date:  11/30/2024   ID:  Donna Larsen, DOB November 20, 1955, MRN [id number]  PCP:  Donna Reagin, PA  Cardiologist:  Donna Riddle, DO  Electrophysiologist:  None   Referring MD: Redmon, Noelle, PA    I am doing well  History of Present Illness:    Donna Larsen is a 69 y.o. female with a hx of paroxysmal atrial fibrillation seen on recent ZIO monitor which was 20%., hypertension, stroke depression, hypothyroidism.   Since her last visit with me she reports that she has been doing well. No complaints today.   Past Medical History:  Diagnosis Date   A-fib (HCC)    on xarelto    Allergies    GERD (gastroesophageal reflux disease)    Gout    Hypertension    Hypothyroidism    Irregular heart beat    Obesity    Prediabetes    Shingles    Thyroid  disease     Past Surgical History:  Procedure Laterality Date   CESAREAN SECTION      Current Medications: Current Meds  Medication Sig   allopurinol (ZYLOPRIM) 100 MG tablet Take 100 mg by mouth daily.   amLODipine  (NORVASC ) 10 MG tablet TAKE ONE TABLET BY MOUTH ONCE A DAY   apixaban  (ELIQUIS ) 5 MG TABS tablet Take 1 tablet (5 mg total) by mouth 2 (two) times daily. OVERDUE for Follow-up, MUST see PROVIDER for FUTURE refills.   atorvastatin (LIPITOR) 20 MG tablet Take 1 tablet by mouth daily.   carvedilol  (COREG ) 6.25 MG tablet Take 1 tablet (6.25 mg total) by mouth 2 (two) times daily   flecainide  (TAMBOCOR ) 50 MG tablet TAKE ONE TABLET BY MOUTH TWICE DAILY   levonorgestrel (KYLEENA) 19.5 MG IUD Take 1 device by intrauterine route.   levothyroxine (SYNTHROID) 125 MCG tablet Take 125 mcg by mouth daily.   Multiple Vitamins-Minerals (CENTRUM SILVER ADULT 50+) TABS 1 tablet Orally once a day   potassium chloride (K-DUR) 10 MEQ tablet Take 10 mEq by mouth every other day.    triamterene-hydrochlorothiazide (MAXZIDE) 75-50 MG per tablet Take 1 tablet by mouth daily.     Allergies:   Dust mite extract,  Other, and Rivaroxaban    Social History   Socioeconomic History   Marital status: Married    Spouse name: Not on file   Number of children: Not on file   Years of education: Not on file   Highest education level: Not on file  Occupational History   Not on file  Tobacco Use   Smoking status: Never   Smokeless tobacco: Never  Substance and Sexual Activity   Alcohol use: Yes    Comment: rarely drink once a month   Drug use: No    Comment: denies any illicit drug use   Sexual activity: Not on file  Other Topics Concern   Not on file  Social History Narrative   Not on file   Social Drivers of Health   Tobacco Use: Low Risk (11/28/2024)   Patient History    Smoking Tobacco Use: Never    Smokeless Tobacco Use: Never    Passive Exposure: Not on file  Financial Resource Strain: Not on file  Food Insecurity: Not on file  Transportation Needs: Not on file  Physical Activity: Not on file  Stress: Not on file  Social Connections: Not on file  Depression (PHQ2-9): Low Risk (10/12/2023)   Depression (PHQ2-9)    PHQ-2 Score: 0  Alcohol  Screen: Not on file  Housing: Not on file  Utilities: Not on file  Health Literacy: Not on file     Family History: The patient's family history includes CAD in her father and mother; Diabetes Mellitus II in her mother; Hyperlipidemia in her mother.  ROS:   Review of Systems  Constitution: Negative for decreased appetite, fever and weight gain.  HENT: Negative for congestion, ear discharge, hoarse voice and sore throat.   Eyes: Negative for discharge, redness, vision loss in right eye and visual halos.  Cardiovascular: Negative for chest pain, dyspnea on exertion, leg swelling, orthopnea and palpitations.  Respiratory: Negative for cough, hemoptysis, shortness of breath and snoring.   Endocrine: Negative for heat intolerance and polyphagia.  Hematologic/Lymphatic: Negative for bleeding problem. Does not bruise/bleed easily.  Skin: Negative for  flushing, nail changes, rash and suspicious lesions.  Musculoskeletal: Negative for arthritis, joint pain, muscle cramps, myalgias, neck pain and stiffness.  Gastrointestinal: Negative for abdominal pain, bowel incontinence, diarrhea and excessive appetite.  Genitourinary: Negative for decreased libido, genital sores and incomplete emptying.  Neurological: Negative for brief paralysis, focal weakness, headaches and loss of balance.  Psychiatric/Behavioral: Negative for altered mental status, depression and suicidal ideas.  Allergic/Immunologic: Negative for HIV exposure and persistent infections.    EKGs/Labs/Other Studies Reviewed:    The following studies were reviewed today:   EKG:  The ekg ordered today demonstrates sinus rhythm, heart rate 67 beats minute.  ZIO monitor February 2024   Patch Wear Time:  12 days and 3 hours (2024-01-31T04:18:54-0500 to 2024-02-12T07:49:44-0500)   Patient had a min HR of 54 bpm, max HR of 200 bpm, and avg HR of 86 bpm. Predominant underlying rhythm was Sinus Rhythm. Atrial Fibrillation occurred (20% burden), ranging from 93-200 bpm (avg of 143 bpm), the longest lasting 6 hours 40 mins with an avg rate of 149 bpm. Atrial Fibrillation was detected within +/- 45 seconds of symptomatic patient event(s). Isolated SVEs were rare (<1.0%), SVE Couplets were rare (<1.0%), and SVE Triplets were rare (<1.0%). Isolated VEs were rare (<1.0%), and no VE  Couplets or VE Triplets were present.    Symptoms associated with atrial fibrillation.   Conclusion: This study is remarkable for symptomatic atrial fibrillation with rapid ventricular rate totem 20% burden.    04/17/2021 The left ventricular ejection fraction is normal (55-65%). Nuclear stress EF: 58%. There was no ST segment deviation noted during stress. The study is normal. This is a low risk study.    TTE 04/17/2021 IMPRESSIONS   1. Left ventricular ejection fraction, by estimation, is 60 to 65%. The  left  ventricle has normal function. The left ventricle has no regional  wall motion abnormalities. There is moderate left ventricular hypertrophy.  Left ventricular diastolic  parameters were normal.   2. Right ventricular systolic function is normal. The right ventricular  size is normal. Tricuspid regurgitation signal is inadequate for assessing  PA pressure.   3. The mitral valve is normal in structure. Trivial mitral valve  regurgitation.   4. The aortic valve was not well visualized. Aortic valve regurgitation  is not visualized. Mild aortic valve sclerosis is present, with no  evidence of aortic valve stenosis.   5. The inferior vena cava is normal in size with greater than 50%  respiratory variability, suggesting right atrial pressure of 3 mmHg.   FINDINGS   Left Ventricle: Left ventricular ejection fraction, by estimation, is 60  to 65%. The left ventricle has normal function. The left ventricle  has no  regional wall motion abnormalities. The left ventricular internal cavity  size was normal in size. There is   moderate left ventricular hypertrophy. Left ventricular diastolic  parameters were normal.   Right Ventricle: The right ventricular size is normal. No increase in  right ventricular wall thickness. Right ventricular systolic function is  normal. Tricuspid regurgitation signal is inadequate for assessing PA  pressure.   Left Atrium: Left atrial size was normal in size.   Right Atrium: Right atrial size was normal in size.   Pericardium: Trivial pericardial effusion is present.   Mitral Valve: The mitral valve is normal in structure. Trivial mitral  valve regurgitation.   Tricuspid Valve: The tricuspid valve is normal in structure. Tricuspid  valve regurgitation is trivial.   Aortic Valve: The aortic valve was not well visualized. Aortic valve  regurgitation is not visualized. Mild aortic valve sclerosis is present,  with no evidence of aortic valve stenosis.    Pulmonic Valve: The pulmonic valve was not well visualized. Pulmonic valve  regurgitation is not visualized.   Aorta: The aortic root and ascending aorta are structurally normal, with  no evidence of dilitation.   Venous: The inferior vena cava is normal in size with greater than 50%  respiratory variability, suggesting right atrial pressure of 3 mmHg.   IAS/Shunts: The interatrial septum was not well visualized.       Recent Labs: No results found for requested labs within last 365 days.  Recent Lipid Panel No results found for: CHOL, TRIG, HDL, CHOLHDL, VLDL, LDLCALC, LDLDIRECT  Physical Exam:    VS:  BP 138/82 (BP Location: Right Arm, Patient Position: Sitting, Cuff Size: Large)   Pulse (!) 59   Ht 5' 4 (1.626 m)   Wt 257 lb 12.8 oz (116.9 kg)   SpO2 94%   BMI 44.25 kg/m     Wt Readings from Last 3 Encounters:  11/28/24 257 lb 12.8 oz (116.9 kg)  02/15/24 262 lb (118.8 kg)  10/13/23 262 lb (118.8 kg)     GEN: Well nourished, well developed in no acute distress HEENT: Normal NECK: No JVD; No carotid bruits LYMPHATICS: No lymphadenopathy CARDIAC: S1S2 noted,RRR, no murmurs, rubs, gallops RESPIRATORY:  Clear to auscultation without rales, wheezing or rhonchi  ABDOMEN: Soft, non-tender, non-distended, +bowel sounds, no guarding. EXTREMITIES: No edema, No cyanosis, no clubbing MUSCULOSKELETAL:  No deformity  SKIN: Warm and dry NEUROLOGIC:  Alert and oriented x 3, non-focal PSYCHIATRIC:  Normal affect, good insight  ASSESSMENT:    1. PAF (paroxysmal atrial fibrillation) (HCC)   2. Primary hypertension   3. Medication management   4. Morbid obesity (HCC)    PLAN:    Paroxysmal Atrial Fibrillation - doing well on coreg  and flecainide . Continue Eliquis . She is in sinus rhythm today. Will get flecainide  level  -Order complete blood count to assess for any abnormalities related to change in anticoagulation.  Diabetes Mellitus - Continue current  management.  Follow-up in 1 year, or sooner if any issues arise. Invite to [employer] Event in February.  The patient understands the need to lose weight with diet and exercise. We have discussed specific strategies for this.   The patient is in agreement with the above plan. The patient left the office in stable condition.  The patient will follow up in 3 months   Medication Adjustments/Labs and Tests Ordered: Current medicines are reviewed at length with the patient today.  Concerns regarding medicines are outlined above.  Orders  Placed This Encounter  Procedures   Flecainide  level   AMB Referral to [facility]   EKG 12-Lead   No orders of the defined types were placed in this encounter.   Patient Instructions  Medication Instructions:  Your physician recommends that you continue on your current medications as directed. Please refer to the Current Medication list given to you today.  *If you need a refill on your cardiac medications before your next appointment, please call your pharmacy*  Lab Work: Flecainide   If you have labs (blood work) drawn today and your tests are completely normal, you will receive your results only by: MyChart Message (if you have MyChart) OR A paper copy in the mail If you have any lab test that is abnormal or we need to change your treatment, we will call you to review the results.   Follow-Up: At [facility], you and your health needs are our priority.  As part of our continuing mission to provide you with exceptional heart care, our providers are all part of one team.  This team includes your primary Cardiologist (physician) and Advanced Practice Providers or APPs (Physician Assistants and Nurse Practitioners) who all work together to provide you with the care you need, when you need it.  Your next appointment:   1 year(s)  Provider:   Shyquan Stallbaumer, DO     Other Instructions:            Adopting a Healthy  Lifestyle.  Know what a healthy weight is for you (roughly BMI <25) and aim to maintain this   Aim for 7+ servings of fruits and vegetables daily   65-80+ fluid ounces of water or unsweet tea for healthy kidneys   Limit to max 1 drink of alcohol per day; avoid smoking/tobacco   Limit animal fats in diet for cholesterol and heart health - choose grass fed whenever available   Avoid highly processed foods, and foods high in saturated/trans fats   Aim for low stress - take time to unwind and care for your mental health   Aim for 150 min of moderate intensity exercise weekly for heart health, and weights twice weekly for bone health   Aim for 7-9 hours of sleep daily   When it comes to diets, agreement about the perfect plan isnt easy to find, even among the experts. Experts at the [facility] [employer] developed an idea known as the Healthy Eating Plate. Just imagine a plate divided into logical, healthy portions.   The emphasis is on diet quality:   Load up on vegetables and fruits - one-half of your plate: Aim for color and variety, and remember that potatoes dont count.   Go for whole grains - one-quarter of your plate: Whole wheat, barley, wheat berries, quinoa, oats, brown rice, and foods made with them. If you want pasta, go with whole wheat pasta.   Protein power - one-quarter of your plate: Fish, chicken, beans, and nuts are all healthy, versatile protein sources. Limit red meat.   The diet, however, does go beyond the plate, offering a few other suggestions.   Use healthy plant oils, such as olive, canola, soy, corn, sunflower and peanut. Check the labels, and avoid partially hydrogenated oil, which have unhealthy trans fats.   If youre thirsty, drink water. Coffee and tea are good in moderation, but skip sugary drinks and limit milk and dairy products to one or two daily servings.   The type of carbohydrate in  the diet is more important than the amount. Some  sources of carbohydrates, such as vegetables, fruits, whole grains, and beans-are healthier than others.   Finally, stay active  Signed, Dub Huntsman, DO  11/30/2024 9:49 AM    [facility] "

## 2024-11-28 NOTE — Patient Instructions (Signed)
 Medication Instructions:  Your physician recommends that you continue on your current medications as directed. Please refer to the Current Medication list given to you today.  *If you need a refill on your cardiac medications before your next appointment, please call your pharmacy*  Lab Work: Flecainide   If you have labs (blood work) drawn today and your tests are completely normal, you will receive your results only by: MyChart Message (if you have MyChart) OR A paper copy in the mail If you have any lab test that is abnormal or we need to change your treatment, we will call you to review the results.   Follow-Up: At [facility], you and your health needs are our priority.  As part of our continuing mission to provide you with exceptional heart care, our providers are all part of one team.  This team includes your primary Cardiologist (physician) and Advanced Practice Providers or APPs (Physician Assistants and Nurse Practitioners) who all work together to provide you with the care you need, when you need it.  Your next appointment:   1 year(s)  Provider:   Kardie Tobb, DO     Other Instructions:

## 2024-12-04 LAB — FLECAINIDE LEVEL: Flecainide: 0.21 ug/mL (ref 0.20–1.00)

## 2024-12-08 ENCOUNTER — Ambulatory Visit: Payer: Self-pay | Admitting: Cardiology

## 2024-12-12 ENCOUNTER — Other Ambulatory Visit: Payer: Self-pay | Admitting: Cardiology

## 2024-12-13 ENCOUNTER — Other Ambulatory Visit: Payer: Self-pay | Admitting: Cardiology

## 2025-01-18 ENCOUNTER — Ambulatory Visit: Admitting: Pharmacist
